# Patient Record
Sex: Male | Born: 1985 | Race: Black or African American | Hispanic: No | Marital: Single | Smoking: Current every day smoker
Health system: Southern US, Community
[De-identification: ages and names within clinical notes are randomized; demographics above are authoritative.]

## PROBLEM LIST (undated history)

## (undated) DIAGNOSIS — F909 Attention-deficit hyperactivity disorder, unspecified type: Secondary | ICD-10-CM

---
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
## 2006-01-22 ENCOUNTER — Emergency Department (HOSPITAL_COMMUNITY): Admission: EM | Admit: 2006-01-22 | Discharge: 2006-01-22 | Payer: Self-pay | Admitting: Emergency Medicine

## 2006-08-16 ENCOUNTER — Emergency Department (HOSPITAL_COMMUNITY): Admission: EM | Admit: 2006-08-16 | Discharge: 2006-08-16 | Payer: Self-pay | Admitting: Emergency Medicine

## 2007-11-21 ENCOUNTER — Encounter (INDEPENDENT_AMBULATORY_CARE_PROVIDER_SITE_OTHER): Payer: Self-pay | Admitting: Internal Medicine

## 2008-02-03 ENCOUNTER — Emergency Department (HOSPITAL_COMMUNITY): Admission: EM | Admit: 2008-02-03 | Discharge: 2008-02-04 | Payer: Self-pay | Admitting: Emergency Medicine

## 2008-05-10 ENCOUNTER — Emergency Department (HOSPITAL_COMMUNITY): Admission: EM | Admit: 2008-05-10 | Discharge: 2008-05-10 | Payer: Self-pay | Admitting: Emergency Medicine

## 2008-07-15 ENCOUNTER — Emergency Department (HOSPITAL_COMMUNITY): Admission: EM | Admit: 2008-07-15 | Discharge: 2008-07-15 | Payer: Self-pay | Admitting: Emergency Medicine

## 2009-02-16 ENCOUNTER — Emergency Department (HOSPITAL_COMMUNITY): Admission: EM | Admit: 2009-02-16 | Discharge: 2009-02-17 | Payer: Self-pay | Admitting: Emergency Medicine

## 2009-08-14 ENCOUNTER — Emergency Department (HOSPITAL_COMMUNITY): Admission: EM | Admit: 2009-08-14 | Discharge: 2009-08-14 | Payer: Self-pay | Admitting: Emergency Medicine

## 2010-04-17 ENCOUNTER — Emergency Department (HOSPITAL_COMMUNITY): Admission: EM | Admit: 2010-04-17 | Discharge: 2010-04-17 | Payer: Self-pay | Admitting: Family Medicine

## 2010-08-24 LAB — GC/CHLAMYDIA PROBE AMP, GENITAL
Chlamydia, DNA Probe: NEGATIVE
GC Probe Amp, Genital: NEGATIVE

## 2010-08-24 LAB — URINALYSIS, ROUTINE W REFLEX MICROSCOPIC
Bilirubin Urine: NEGATIVE
Ketones, ur: NEGATIVE mg/dL
Nitrite: NEGATIVE
Protein, ur: NEGATIVE mg/dL
Urobilinogen, UA: 0.2 mg/dL (ref 0.0–1.0)

## 2010-08-24 LAB — GLUCOSE, CAPILLARY

## 2010-08-26 ENCOUNTER — Emergency Department (HOSPITAL_COMMUNITY)
Admission: EM | Admit: 2010-08-26 | Discharge: 2010-08-26 | Disposition: A | Discharge status: Home / Self Care | Payer: Medicaid Other | Attending: Emergency Medicine | Admitting: Emergency Medicine

## 2010-08-26 DIAGNOSIS — F3289 Other specified depressive episodes: Secondary | ICD-10-CM | POA: Insufficient documentation

## 2010-08-26 DIAGNOSIS — I10 Essential (primary) hypertension: Secondary | ICD-10-CM | POA: Insufficient documentation

## 2010-08-26 DIAGNOSIS — R300 Dysuria: Secondary | ICD-10-CM | POA: Insufficient documentation

## 2010-08-26 DIAGNOSIS — R369 Urethral discharge, unspecified: Secondary | ICD-10-CM | POA: Insufficient documentation

## 2010-08-26 DIAGNOSIS — F329 Major depressive disorder, single episode, unspecified: Secondary | ICD-10-CM | POA: Insufficient documentation

## 2010-08-28 LAB — GC/CHLAMYDIA PROBE AMP, GENITAL: GC Probe Amp, Genital: UNDETERMINED

## 2011-03-04 LAB — URINALYSIS, ROUTINE W REFLEX MICROSCOPIC
Bilirubin Urine: NEGATIVE
Hgb urine dipstick: NEGATIVE
Protein, ur: NEGATIVE
Specific Gravity, Urine: 1.021
Urobilinogen, UA: 0.2

## 2011-03-04 LAB — GC/CHLAMYDIA PROBE AMP, GENITAL: GC Probe Amp, Genital: NEGATIVE

## 2011-03-06 LAB — GC/CHLAMYDIA PROBE AMP, GENITAL: Chlamydia, DNA Probe: NEGATIVE

## 2011-03-06 LAB — URINALYSIS, ROUTINE W REFLEX MICROSCOPIC
Hgb urine dipstick: NEGATIVE
Specific Gravity, Urine: 1.024 (ref 1.005–1.030)
Urobilinogen, UA: 1 mg/dL (ref 0.0–1.0)

## 2011-03-06 LAB — URINE MICROSCOPIC-ADD ON

## 2012-09-13 ENCOUNTER — Emergency Department (HOSPITAL_COMMUNITY)
Admission: EM | Admit: 2012-09-13 | Discharge: 2012-09-13 | Disposition: A | Discharge status: Home / Self Care | Payer: Self-pay | Attending: Emergency Medicine | Admitting: Emergency Medicine

## 2012-09-13 ENCOUNTER — Encounter (HOSPITAL_COMMUNITY): Payer: Self-pay | Admitting: Emergency Medicine

## 2012-09-13 DIAGNOSIS — N342 Other urethritis: Secondary | ICD-10-CM | POA: Insufficient documentation

## 2012-09-13 DIAGNOSIS — Z8659 Personal history of other mental and behavioral disorders: Secondary | ICD-10-CM | POA: Insufficient documentation

## 2012-09-13 DIAGNOSIS — F17200 Nicotine dependence, unspecified, uncomplicated: Secondary | ICD-10-CM | POA: Insufficient documentation

## 2012-09-13 HISTORY — DX: Attention-deficit hyperactivity disorder, unspecified type: F90.9

## 2012-09-13 LAB — URINALYSIS, ROUTINE W REFLEX MICROSCOPIC
Bilirubin Urine: NEGATIVE
Glucose, UA: NEGATIVE mg/dL
Ketones, ur: NEGATIVE mg/dL
Nitrite: NEGATIVE
pH: 6.5 (ref 5.0–8.0)

## 2012-09-13 MED ORDER — DOXYCYCLINE HYCLATE 100 MG PO TABS
100.0000 mg | ORAL_TABLET | Freq: Once | ORAL | Status: AC
  Administered 2012-09-13: 100 mg via ORAL
  Filled 2012-09-13: qty 1

## 2012-09-13 MED ORDER — LIDOCAINE HCL (PF) 1 % IJ SOLN
INTRAMUSCULAR | Status: AC
  Administered 2012-09-13: 2 mL
  Filled 2012-09-13: qty 5

## 2012-09-13 MED ORDER — DOXYCYCLINE HYCLATE 100 MG PO TABS: 100.0000 mg | ORAL_TABLET | Freq: Two times a day (BID) | ORAL | Status: DC

## 2012-09-13 MED ORDER — AZITHROMYCIN 250 MG PO TABS
1000.0000 mg | ORAL_TABLET | Freq: Once | ORAL | Status: AC
  Administered 2012-09-13: 1000 mg via ORAL
  Filled 2012-09-13: qty 4

## 2012-09-13 MED ORDER — CEFTRIAXONE SODIUM 250 MG IJ SOLR
250.0000 mg | Freq: Once | INTRAMUSCULAR | Status: AC
  Administered 2012-09-13: 250 mg via INTRAMUSCULAR
  Filled 2012-09-13: qty 250

## 2012-09-13 NOTE — ED Notes (Signed)
Patient with burning sensation when urinating.  No unprotected sex per patient.  No nausea or vomiting.

## 2012-09-13 NOTE — ED Provider Notes (Signed)
Medical screening examination/treatment/procedure(s) were performed by non-physician practitioner and as supervising physician I was immediately available for consultation/collaboration.  Beadie Matsunaga K Sharyl Panchal-Rasch, MD 09/13/12 0419 

## 2012-09-13 NOTE — ED Notes (Signed)
The patient is AOx4 and comfortable with his discharge instructions. 

## 2012-09-13 NOTE — ED Provider Notes (Signed)
History     CSN: [id number]  Arrival date & time 09/13/12  0036   First MD Initiated Contact with Patient 09/13/12 [phone]      Chief Complaint  Patient presents with  . Dysuria    (Consider location/radiation/quality/duration/timing/severity/associated sxs/prior treatment) HPI Comments: Patient reports, dysuria with every urination starting last night, per patient has had no unprotected intercourse  Patient is a 27 y.o. male presenting with dysuria. The history is provided by the patient.  Dysuria  This is a new problem. The problem occurs every urination. The problem has been gradually worsening. The quality of the pain is described as burning. The pain is at a severity of 3/10. The pain is mild. There has been no fever. Pertinent negatives include no chills and no flank pain. He has tried nothing for the symptoms.    Past Medical History  Diagnosis Date  . ADHD (attention deficit hyperactivity disorder)     History reviewed. No pertinent past surgical history.  History reviewed. No pertinent family history.  History  Substance Use Topics  . Smoking status: Current Some Day Smoker  . Smokeless tobacco: Not on file  . Alcohol Use: No      Review of Systems  Constitutional: Negative for fever and chills.  Genitourinary: Positive for dysuria and discharge. Negative for flank pain.  All other systems reviewed and are negative.    Allergies  Review of patient's allergies indicates no known allergies.  Home Medications   Current Outpatient Rx  Name  Route  Sig  Dispense  Refill  . doxycycline (VIBRA-TABS) 100 MG tablet   Oral   Take 1 tablet (100 mg total) by mouth 2 (two) times daily.   13 tablet   0     BP 139/72  Pulse 79  Temp(Src) 97.9 F (36.6 C) (Oral)  Resp 15  Ht 5\' 11"  (1.803 m)  Wt 175 lb (79.379 kg)  BMI 24.42 kg/m2  SpO2 98%  Physical Exam  Nursing note and vitals reviewed. Constitutional: He appears well-developed and well-nourished.   HENT:  Head: Normocephalic.  Eyes: Pupils are equal, round, and reactive to light.  Neck: Normal range of motion.  Cardiovascular: Normal rate.   Pulmonary/Chest: Effort normal.  Abdominal: Soft.  Genitourinary: Right testis shows no swelling and no tenderness. Right testis is descended. Left testis shows no swelling and no tenderness. Left testis is descended. No penile tenderness. Discharge found.  Musculoskeletal: Normal range of motion.  Neurological: He is alert.  Skin: Skin is warm.    ED Course  Procedures (including critical care time)  Labs Reviewed  URINALYSIS, ROUTINE W REFLEX MICROSCOPIC - Abnormal; Notable for the following:    APPearance CLOUDY (*)    Hgb urine dipstick TRACE (*)    Leukocytes, UA LARGE (*)    All other components within normal limits  URINE MICROSCOPIC-ADD ON - Abnormal; Notable for the following:    Bacteria, UA FEW (*)    All other components within normal limits  URINE CULTURE   No results found.   1. Urethritis       MDM   She was treated in the emergency, with IM Rocephin and by mouth azithromycin and, doxycycline.  He'll be discharged home with a prescription for doxycycline        Arman Filter, NP 09/13/12 0343  Arman Filter, NP 09/13/12 0344  Arman Filter, NP 09/13/12 [phone]

## 2012-09-14 LAB — URINE CULTURE
Colony Count: NO GROWTH
Culture: NO GROWTH

## 2012-11-26 ENCOUNTER — Emergency Department (HOSPITAL_COMMUNITY)
Admission: EM | Admit: 2012-11-26 | Discharge: 2012-11-27 | Disposition: A | Discharge status: Home / Self Care | Payer: Self-pay | Attending: Emergency Medicine | Admitting: Emergency Medicine

## 2012-11-26 ENCOUNTER — Encounter (HOSPITAL_COMMUNITY): Payer: Self-pay | Admitting: Emergency Medicine

## 2012-11-26 ENCOUNTER — Emergency Department (HOSPITAL_COMMUNITY): Payer: Self-pay

## 2012-11-26 DIAGNOSIS — N390 Urinary tract infection, site not specified: Secondary | ICD-10-CM | POA: Insufficient documentation

## 2012-11-26 DIAGNOSIS — R1013 Epigastric pain: Secondary | ICD-10-CM

## 2012-11-26 DIAGNOSIS — E876 Hypokalemia: Secondary | ICD-10-CM | POA: Insufficient documentation

## 2012-11-26 DIAGNOSIS — Z8659 Personal history of other mental and behavioral disorders: Secondary | ICD-10-CM | POA: Insufficient documentation

## 2012-11-26 DIAGNOSIS — F17200 Nicotine dependence, unspecified, uncomplicated: Secondary | ICD-10-CM | POA: Insufficient documentation

## 2012-11-26 LAB — COMPREHENSIVE METABOLIC PANEL
ALT: 13 U/L (ref 0–53)
Calcium: 8.6 mg/dL (ref 8.4–10.5)
Creatinine, Ser: 0.81 mg/dL (ref 0.50–1.35)
GFR calc Af Amer: >90 mL/min (ref >90)
GFR calc non Af Amer: >90 mL/min (ref >90)
Glucose, Bld: 123 mg/dL — ABNORMAL HIGH (ref 70–99)
Sodium: 139 mEq/L (ref 135–145)
Total Protein: 5.9 g/dL — ABNORMAL LOW (ref 6.0–8.3)

## 2012-11-26 LAB — URINALYSIS, ROUTINE W REFLEX MICROSCOPIC
Bilirubin Urine: NEGATIVE
Glucose, UA: NEGATIVE mg/dL
Hgb urine dipstick: NEGATIVE
Ketones, ur: NEGATIVE mg/dL
Protein, ur: NEGATIVE mg/dL
Urobilinogen, UA: 0.2 mg/dL (ref 0.0–1.0)

## 2012-11-26 LAB — CBC WITH DIFFERENTIAL/PLATELET
Basophils Absolute: 0.1 10*3/uL (ref 0.0–0.1)
Eosinophils Absolute: 0.2 10*3/uL (ref 0.0–0.7)
Eosinophils Relative: 4 % (ref 0–5)
HCT: 39.3 % (ref 39.0–52.0)
Lymphs Abs: 1.9 10*3/uL (ref 0.7–4.0)
MCH: 29.8 pg (ref 26.0–34.0)
MCV: 83.6 fL (ref 78.0–100.0)
Monocytes Absolute: 0.6 10*3/uL (ref 0.1–1.0)
Platelets: 229 10*3/uL (ref 150–400)
RDW: 12.6 % (ref 11.5–15.5)

## 2012-11-26 LAB — URINE MICROSCOPIC-ADD ON

## 2012-11-26 MED ORDER — GI COCKTAIL ~~LOC~~
30.0000 mL | Freq: Once | ORAL | Status: AC
  Administered 2012-11-26: 30 mL via ORAL
  Filled 2012-11-26: qty 30

## 2012-11-26 MED ORDER — CEFTRIAXONE SODIUM 1 G IJ SOLR
1.0000 g | Freq: Once | INTRAMUSCULAR | Status: AC
  Administered 2012-11-26: 1 g via INTRAVENOUS
  Filled 2012-11-26: qty 10

## 2012-11-26 MED ORDER — POTASSIUM CHLORIDE CRYS ER 20 MEQ PO TBCR
40.0000 meq | EXTENDED_RELEASE_TABLET | Freq: Once | ORAL | Status: AC
  Administered 2012-11-26: 40 meq via ORAL
  Filled 2012-11-26: qty 1

## 2012-11-26 MED ORDER — AZITHROMYCIN 250 MG PO TABS
1000.0000 mg | ORAL_TABLET | Freq: Once | ORAL | Status: AC
  Administered 2012-11-26: 1000 mg via ORAL
  Filled 2012-11-26: qty 4

## 2012-11-26 NOTE — ED Notes (Signed)
PT. REPORTS MID ABDOMINAL PAIN WITH DIARRHEA ONSET Monday , DENIES NAUSEA OR VOMITTING , NO FEVER OR CHILLS, RESPIRATIONS UNLABORED. NO CHEST PAIN .

## 2012-11-26 NOTE — ED Notes (Addendum)
Pt came to the ED because he has been having mid-abdominal pain  Since Monday. He stated that has has been having loose stools, as well.  He stated pain is worse with eating. He stated that there is no blood in stools. Pain is cramping feeling. Last time he ate was 2 hours ago. No N/V No cardiac or respiratory distress. He is alert and oriented . Will continue to monitor.

## 2012-11-26 NOTE — ED Provider Notes (Signed)
History    CSN: [id number] Arrival date & time 11/26/12  2152  First MD Initiated Contact with Patient 11/26/12 2303     Chief Complaint  Patient presents with  . Abdominal Pain   (Consider location/radiation/quality/duration/timing/severity/associated sxs/prior Treatment) Patient is a 27 y.o. male presenting with abdominal pain.  Abdominal Pain Associated symptoms include abdominal pain. Pertinent negatives include no chest pain, no headaches and no shortness of breath.  \ Hx per PT. Onset 6 days ago, epigastric and RUQ, feels like sharp and stabbing pain, did radiate to his back at first but not in the last few days. Tried alieve without relief. No known alleviating factors. Food makes it worse.  No black or tarry stools. No h/o same. No F/C. No recent travel. No sick contacts. No previous surgery. Pain is 5/10 now. No N/V is having multiple BMs daily.   Past Medical History  Diagnosis Date  . ADHD (attention deficit hyperactivity disorder)    History reviewed. No pertinent past surgical history. No family history on file. History  Substance Use Topics  . Smoking status: Current Some Day Smoker  . Smokeless tobacco: Not on file  . Alcohol Use: No    Review of Systems  Constitutional: Negative for fever and chills.  HENT: Negative for neck pain and neck stiffness.   Eyes: Negative for visual disturbance.  Respiratory: Negative for shortness of breath.   Cardiovascular: Negative for chest pain.  Gastrointestinal: Positive for abdominal pain. Negative for vomiting.  Genitourinary: Negative for dysuria, urgency, frequency, discharge, penile swelling and testicular pain.  Musculoskeletal: Negative for back pain.  Skin: Negative for rash.  Neurological: Negative for headaches.  All other systems reviewed and are negative.    Allergies  Review of patient's allergies indicates no known allergies.  Home Medications   Current Outpatient Rx  Name  Route  Sig  Dispense   Refill  . naproxen sodium (ANAPROX) 220 MG tablet   Oral   Take 440 mg by mouth daily as needed (for pain).          BP 129/69  Pulse 76  Temp(Src) 97.9 F (36.6 C) (Oral)  Resp 14  SpO2 97% Physical Exam  Constitutional: He is oriented to person, place, and time. He appears well-developed and well-nourished.  HENT:  Head: Normocephalic and atraumatic.  Eyes: EOM are normal. Pupils are equal, round, and reactive to light.  Neck: Neck supple.  Cardiovascular: Normal rate, regular rhythm and intact distal pulses.   Pulmonary/Chest: Effort normal and breath sounds normal. No respiratory distress.  Abdominal: Soft. Bowel sounds are normal. He exhibits no distension. There is no rebound and no guarding.  TTP Epigastric and RUQ, neg Murphys  Musculoskeletal: Normal range of motion. He exhibits no edema.  Neurological: He is alert and oriented to person, place, and time.  Skin: Skin is warm and dry.    ED Course  Procedures   Results for orders placed during the hospital encounter of 11/26/12  URINALYSIS, ROUTINE W REFLEX MICROSCOPIC      Result Value Range   Color, Urine AMBER (*) YELLOW   APPearance CLOUDY (*) CLEAR   Specific Gravity, Urine 1.025  1.005 - 1.030   pH 6.0  5.0 - 8.0   Glucose, UA NEGATIVE  NEGATIVE mg/dL   Hgb urine dipstick NEGATIVE  NEGATIVE   Bilirubin Urine NEGATIVE  NEGATIVE   Ketones, ur NEGATIVE  NEGATIVE mg/dL   Protein, ur NEGATIVE  NEGATIVE mg/dL   Urobilinogen, UA 0.2  0.0 - 1.0 mg/dL   Nitrite NEGATIVE  NEGATIVE   Leukocytes, UA SMALL (*) NEGATIVE  CBC WITH DIFFERENTIAL      Result Value Range   WBC 6.0  4.0 - 10.5 K/uL   RBC 4.70  4.22 - 5.81 MIL/uL   Hemoglobin 14.0  13.0 - 17.0 g/dL   HCT 16.1  09.6 - 04.5 %   MCV 83.6  78.0 - 100.0 fL   MCH 29.8  26.0 - 34.0 pg   MCHC 35.6  30.0 - 36.0 g/dL   RDW 40.9  81.1 - 91.4 %   Platelets 229  150 - 400 K/uL   Neutrophils Relative % 54  43 - 77 %   Neutro Abs 3.2  1.7 - 7.7 K/uL    Lymphocytes Relative 32  12 - 46 %   Lymphs Abs 1.9  0.7 - 4.0 K/uL   Monocytes Relative 10  3 - 12 %   Monocytes Absolute 0.6  0.1 - 1.0 K/uL   Eosinophils Relative 4  0 - 5 %   Eosinophils Absolute 0.2  0.0 - 0.7 K/uL   Basophils Relative 1  0 - 1 %   Basophils Absolute 0.1  0.0 - 0.1 K/uL  COMPREHENSIVE METABOLIC PANEL      Result Value Range   Sodium 139  135 - 145 mEq/L   Potassium 3.1 (*) 3.5 - 5.1 mEq/L   Chloride 104  96 - 112 mEq/L   CO2 26  19 - 32 mEq/L   Glucose, Bld 123 (*) 70 - 99 mg/dL   BUN 10  6 - 23 mg/dL   Creatinine, Ser 7.82  0.50 - 1.35 mg/dL   Calcium 8.6  8.4 - 95.6 mg/dL   Total Protein 5.9 (*) 6.0 - 8.3 g/dL   Albumin 3.1 (*) 3.5 - 5.2 g/dL   AST 22  0 - 37 U/L   ALT 13  0 - 53 U/L   Alkaline Phosphatase 69  39 - 117 U/L   Total Bilirubin 0.5  0.3 - 1.2 mg/dL   GFR calc non Af Amer >90  >90 mL/min   GFR calc Af Amer >90  >90 mL/min  LIPASE, BLOOD      Result Value Range   Lipase 18  11 - 59 U/L  URINE MICROSCOPIC-ADD ON      Result Value Range   WBC, UA 11-20  <3 WBC/hpf   Bacteria, UA FEW (*) RARE   Urine-Other MUCOUS PRESENT     US Abdomen Complete  11/27/2012   *RADIOLOGY REPORT*  Clinical Data:  Right upper quadrant abdominal pain, epigastric abdominal pain, diarrhea -  COMPLETE ABDOMINAL ULTRASOUND  Comparison:  None.  Findings:  Gallbladder:  The gallbladder is under distended.  No definite gallbladder wall thickening or pericholecystic fluid.  No echogenic gallstones or gallbladder sludge.  Negative sonographic Murphy's sign.  Common bile duct:  Normal in size measuring 3 mm in diameter  Liver:  Homogeneous hepatic echotexture.  No discrete hepatic lesions.  No definite evidence of intrahepatic biliary ductal dilatation.  No ascites.  IVC:  Appears normal.  Pancreas:  Limited visualization of the pancreatic head and neck is normal.  Visualization of the pancreatic body and tail is obscured by bowel gas.  Spleen:  Normal in size measuring 6.5 cm in  length.  Right Kidney:  Normal cortical thickness, echogenicity and size, measuring 11.5 cm in length.  No focal renal lesions.  No echogenic renal stones.  No  urinary obstruction.  Left Kidney:  Normal cortical thickness, echogenicity and size, measuring 11.7 cm in length.  No focal renal lesions.  No echogenic renal stones.  No urinary obstruction.  Abdominal aorta:  No aneurysm identified.  IMPRESSION: Under distended but otherwise normal appearing gallbladder. Otherwise, unremarkable abdominal ultrasound.   Original Report Authenticated By: Tacey Ruiz, MD    UA reviewed - denies UTI symptoms or any recent sexual activity. Old records reviewed was treated for urethritis April 2014. U Cx at that time no growth. Prior U Cx polymicrobial.   IV rocephin, zofran, protonix. PO azithro and potassium Improved with GI cocktail  Given chief complaint of epigastric pain and denies and GU or UTI symptoms, will not prescribe Doxy. RX Keflex with U Cx pending. Outpatient referral provided. GERD precautions. RX pepcid  MDM  Epigastric pain x 3 days worse with food  Labs, UA, [place]  Medications provided  VS, old records as above and nurses notes reviewed  Sunnie Nielsen, MD 11/27/12 [phone]

## 2012-11-26 NOTE — ED Notes (Signed)
Report given to Don RN.

## 2012-11-27 MED ORDER — PANTOPRAZOLE SODIUM 40 MG PO TBEC
40.0000 mg | DELAYED_RELEASE_TABLET | Freq: Once | ORAL | Status: AC
  Administered 2012-11-27: 40 mg via ORAL
  Filled 2012-11-27: qty 1

## 2012-11-27 MED ORDER — FAMOTIDINE 20 MG PO TABS: 20.0000 mg | ORAL_TABLET | Freq: Two times a day (BID) | ORAL | Status: DC

## 2012-11-27 MED ORDER — CEPHALEXIN 500 MG PO CAPS: 500.0000 mg | ORAL_CAPSULE | Freq: Four times a day (QID) | ORAL | Status: DC

## 2012-11-27 MED ORDER — ONDANSETRON HCL 4 MG/2ML IJ SOLN
INTRAMUSCULAR | Status: AC
  Filled 2012-11-27: qty 2

## 2012-11-27 MED ORDER — ONDANSETRON HCL 4 MG/2ML IJ SOLN
4.0000 mg | Freq: Once | INTRAMUSCULAR | Status: AC
  Administered 2012-11-27: 4 mg via INTRAVENOUS

## 2012-11-28 LAB — URINE CULTURE

## 2016-01-10 ENCOUNTER — Telehealth: Payer: Self-pay

## 2016-01-10 NOTE — Telephone Encounter (Signed)
Patient contacted regarding new intake appointment. Date and time given. Information given regarding documents needed to qualify for financial eligibility.  Minh Jasper K Jossalin Chervenak, RN  

## 2016-01-14 ENCOUNTER — Ambulatory Visit: Payer: Medicaid Other

## 2016-02-13 ENCOUNTER — Ambulatory Visit (INDEPENDENT_AMBULATORY_CARE_PROVIDER_SITE_OTHER): Payer: Self-pay

## 2016-02-13 ENCOUNTER — Ambulatory Visit: Payer: Self-pay

## 2016-02-13 DIAGNOSIS — Z79899 Other long term (current) drug therapy: Secondary | ICD-10-CM

## 2016-02-13 DIAGNOSIS — Z23 Encounter for immunization: Secondary | ICD-10-CM

## 2016-02-13 DIAGNOSIS — Z113 Encounter for screening for infections with a predominantly sexual mode of transmission: Secondary | ICD-10-CM

## 2016-02-13 DIAGNOSIS — B20 Human immunodeficiency virus [HIV] disease: Secondary | ICD-10-CM

## 2016-02-13 LAB — COMPLETE METABOLIC PANEL WITH GFR
ALT: 9 U/L (ref 9–46)
AST: 14 U/L (ref 10–40)
Albumin: 4.2 g/dL (ref 3.6–5.1)
Alkaline Phosphatase: 77 U/L (ref 40–115)
BILIRUBIN TOTAL: 0.6 mg/dL (ref 0.2–1.2)
BUN: 9 mg/dL (ref 7–25)
CO2: 26 mmol/L (ref 20–31)
Calcium: 9.2 mg/dL (ref 8.6–10.3)
Chloride: 107 mmol/L (ref 98–110)
Creat: 0.76 mg/dL (ref 0.60–1.35)
GLUCOSE: 92 mg/dL (ref 65–99)
POTASSIUM: 3.4 mmol/L — AB (ref 3.5–5.3)
SODIUM: 140 mmol/L (ref 135–146)
TOTAL PROTEIN: 6.9 g/dL (ref 6.1–8.1)

## 2016-02-13 LAB — CBC WITH DIFFERENTIAL/PLATELET
BASOS ABS: 39 {cells}/uL (ref 0–200)
Basophils Relative: 1 %
EOS ABS: 195 {cells}/uL (ref 15–500)
EOS PCT: 5 %
HCT: 38.2 % — ABNORMAL LOW (ref 38.5–50.0)
Hemoglobin: 13.3 g/dL (ref 13.2–17.1)
LYMPHS PCT: 36 %
Lymphs Abs: 1404 cells/uL (ref 850–3900)
MCH: 29.7 pg (ref 27.0–33.0)
MCHC: 34.8 g/dL (ref 32.0–36.0)
MCV: 85.3 fL (ref 80.0–100.0)
MONOS PCT: 11 %
MPV: 9.3 fL (ref 7.5–12.5)
Monocytes Absolute: 429 cells/uL (ref 200–950)
NEUTROS ABS: 1833 {cells}/uL (ref 1500–7800)
NEUTROS PCT: 47 %
PLATELETS: 230 10*3/uL (ref 140–400)
RBC: 4.48 MIL/uL (ref 4.20–5.80)
RDW: 13.3 % (ref 11.0–15.0)
WBC: 3.9 10*3/uL (ref 3.8–10.8)

## 2016-02-13 LAB — LIPID PANEL
CHOL/HDL RATIO: 3 ratio (ref <=5.0)
Cholesterol: 148 mg/dL (ref 125–200)
HDL: 49 mg/dL (ref >=40)
LDL CALC: 87 mg/dL (ref <130)
Triglycerides: 61 mg/dL (ref <150)
VLDL: 12 mg/dL (ref <30)

## 2016-02-14 ENCOUNTER — Encounter: Payer: Self-pay | Admitting: Internal Medicine

## 2016-02-14 LAB — URINALYSIS
Bilirubin Urine: NEGATIVE
GLUCOSE, UA: NEGATIVE
HGB URINE DIPSTICK: NEGATIVE
Ketones, ur: NEGATIVE
LEUKOCYTES UA: NEGATIVE
Nitrite: NEGATIVE
PROTEIN: NEGATIVE
Specific Gravity, Urine: 1.024 (ref 1.001–1.035)
pH: 6 (ref 5.0–8.0)

## 2016-02-14 LAB — HEPATITIS B SURFACE ANTIGEN: HEP B S AG: NEGATIVE

## 2016-02-14 LAB — URINE CYTOLOGY ANCILLARY ONLY
Chlamydia: NEGATIVE
Neisseria Gonorrhea: NEGATIVE

## 2016-02-14 LAB — HIV-1 RNA ULTRAQUANT REFLEX TO GENTYP+
HIV 1 RNA QUANT: 14057 {copies}/mL — AB (ref <20)
HIV-1 RNA QUANT, LOG: 4.15 {Log_copies}/mL — AB (ref <1.30)

## 2016-02-14 LAB — HEPATITIS C ANTIBODY: HCV AB: NEGATIVE

## 2016-02-14 LAB — QUANTIFERON TB GOLD ASSAY (BLOOD)
Interferon Gamma Release Assay: NEGATIVE
MITOGEN-NIL SO: 3.44 [IU]/mL
QUANTIFERON NIL VALUE: 0.03 [IU]/mL

## 2016-02-14 LAB — T-HELPER CELL (CD4) - (RCID CLINIC ONLY)
CD4 T CELL ABS: 490 /uL (ref 400–2700)
CD4 T CELL HELPER: 34 % (ref 33–55)

## 2016-02-14 LAB — HEPATITIS B SURFACE ANTIBODY,QUALITATIVE

## 2016-02-14 LAB — RPR

## 2016-02-14 LAB — HEPATITIS A ANTIBODY, TOTAL: HEP A TOTAL AB: NONREACTIVE

## 2016-02-14 LAB — HEPATITIS B CORE ANTIBODY, TOTAL: Hep B Core Total Ab: NONREACTIVE

## 2016-02-18 LAB — HLA B*5701: HLA-B 5701 W/RFLX HLA-B HIGH: NEGATIVE

## 2016-02-26 LAB — HIV-1 GENOTYPR PLUS

## 2016-03-12 ENCOUNTER — Ambulatory Visit: Payer: Self-pay | Admitting: *Deleted

## 2016-03-12 ENCOUNTER — Encounter: Payer: Self-pay | Admitting: Internal Medicine

## 2016-03-12 ENCOUNTER — Ambulatory Visit (INDEPENDENT_AMBULATORY_CARE_PROVIDER_SITE_OTHER): Payer: Self-pay | Admitting: Internal Medicine

## 2016-03-12 DIAGNOSIS — F909 Attention-deficit hyperactivity disorder, unspecified type: Secondary | ICD-10-CM | POA: Insufficient documentation

## 2016-03-12 DIAGNOSIS — B20 Human immunodeficiency virus [HIV] disease: Secondary | ICD-10-CM

## 2016-03-12 DIAGNOSIS — Z21 Asymptomatic human immunodeficiency virus [HIV] infection status: Secondary | ICD-10-CM | POA: Insufficient documentation

## 2016-03-12 DIAGNOSIS — F908 Attention-deficit hyperactivity disorder, other type: Secondary | ICD-10-CM

## 2016-03-12 MED ORDER — ELVITEG-COBIC-EMTRICIT-TENOFAF 150-150-200-10 MG PO TABS: 1.0000 | ORAL_TABLET | Freq: Every day | ORAL | 5 refills | Status: DC

## 2016-03-12 NOTE — Progress Notes (Signed)
Patient ID: Aaron Stephens, male    DOB: 04/25/1986, 30 y.o.   MRN: [id number]  Reason for visit: to establish care as a new patient with HIV  HPI:   Patient was first diagnosed after giving blood earlier this year.  He was tested as part blood transfusion.  Previously tested negative while giving blood.  The CD4 count is 490, viral load 14,057.  There have been no associated symptoms.  He endorses only heterosexual contact.  History of gonorrhea, no history of syphilis.  Accepting of diagnosis and feels ready for treatment.  Has already finished paperwork for ADAP and is approved.  No weight loss, no concerns.  Has condoms.   Past Medical History:  Diagnosis Date  . ADHD (attention deficit hyperactivity disorder)     Prior to Admission medications   Medication Sig Start Date End Date Taking? Authorizing Provider  elvitegravir-cobicistat-emtricitabine-tenofovir (GENVOYA) 150-150-200-10 MG TABS tablet Take 1 tablet by mouth daily. 03/12/16   Gardiner Barefootobert W Rafeal Skibicki, MD    No Known Allergies  Social History  Substance Use Topics  . Smoking status: Current Some Day Smoker    Types: Cigarettes    Start date: 06/02/2007  . Smokeless tobacco: Never Used  . Alcohol use No    FMH: no renal disease, cardiovascular disease  Review of Systems Constitutional: negative for fevers, chills, sweats, fatigue, malaise, anorexia and weight loss Respiratory: negative for cough Cardiovascular: negative for dyspnea Gastrointestinal: negative for diarrhea Musculoskeletal: negative for myalgias and arthralgias All other systems reviewed and are negative   CONSTITUTIONAL:in no apparent distress and alert  Vitals:   03/12/16 0918  BP: 114/66  Pulse: 74  Temp: 97.9 F (36.6 C)   EYES: anicteric HENT: no thrush CARD:Cor RRR and No murmurs RESP:CTA B; normal respiratory effort NW:GNFAOGI:Bowel sounds are normal, liver is not enlarged, spleen is not enlarged MS:no pedal edema noted SKIN: no rashes NEURO:  non-focal  Lab Results  Component Value Date   HIV1RNAQUANT 14,057 (H) 02/13/2016    Assessment: new patient here with HIV.  Discussed with patient treatment options and side effects, benefits of treatment, long term outcomes.  I discussed the severity of untreated HIV including higher cancer risk, opportunistic infections, renal failure.  Also discussed needing to use condoms, partner disclosure, necessary vaccines, blood monitoring.  All questions answered.    Plan: 1) start Genvoya 1 time daily 2) labs in 4 weeks 3) follow up with me in 5 weeks

## 2016-03-12 NOTE — BH Specialist Note (Signed)
Counselor met with Aaron Stephens in the exam room for a warm hand off.  Patient was oriented times four with good affect and dress.  Patient was alert but not very talkative.  Patient shared that he would like to talk to someone about processing through his grief of close family deaths over the last year. Counselor provided support and encouragement.  Counselor provided patient a contact information and encouraged him to make an appointment when he checked out today.  Patient is scheduled for an appointment next week.   Rolena Infante, MA, [facility]

## 2016-03-17 ENCOUNTER — Ambulatory Visit: Payer: Self-pay | Admitting: *Deleted

## 2016-04-02 ENCOUNTER — Encounter: Payer: Self-pay | Admitting: *Deleted

## 2016-04-13 ENCOUNTER — Other Ambulatory Visit: Payer: Self-pay

## 2016-04-16 ENCOUNTER — Encounter: Payer: Self-pay | Admitting: Internal Medicine

## 2016-04-16 ENCOUNTER — Ambulatory Visit (INDEPENDENT_AMBULATORY_CARE_PROVIDER_SITE_OTHER): Payer: Self-pay | Admitting: Internal Medicine

## 2016-04-16 VITALS — BP 113/76 | HR 73 | Temp 98.3°F | Ht 71.0 in | Wt 171.0 lb

## 2016-04-16 DIAGNOSIS — F329 Major depressive disorder, single episode, unspecified: Secondary | ICD-10-CM | POA: Insufficient documentation

## 2016-04-16 DIAGNOSIS — F320 Major depressive disorder, single episode, mild: Secondary | ICD-10-CM

## 2016-04-16 DIAGNOSIS — B20 Human immunodeficiency virus [HIV] disease: Secondary | ICD-10-CM

## 2016-04-16 DIAGNOSIS — Z21 Asymptomatic human immunodeficiency virus [HIV] infection status: Secondary | ICD-10-CM

## 2016-04-16 DIAGNOSIS — F32A Depression, unspecified: Secondary | ICD-10-CM | POA: Insufficient documentation

## 2016-04-16 MED ORDER — ELVITEG-COBIC-EMTRICIT-TENOFAF 150-150-200-10 MG PO TABS: 1.0000 | ORAL_TABLET | Freq: Every day | ORAL | 5 refills | Status: DC

## 2016-04-16 NOTE — Assessment & Plan Note (Signed)
He plans to start now.  Labs in 3 weeks with Pharm D follow up in 4 weeks.  3 months with me with CD4 and viral load before if on medications.

## 2016-04-16 NOTE — Assessment & Plan Note (Signed)
Seems situational with job loss.  No SI.  Will refer to counseling.

## 2016-04-16 NOTE — Progress Notes (Signed)
CC: Follow up for HIV  Interval history: Comes in for follow up.  Was seen as a new patient last visit and after discussion was to start Genvoya.  He tells me though he went to the pharmacy and no prescription there.  He also endorses depression and interested in seeing our counselor.  Recently lost his job.  No other new issues.    Prior to Admission medications   Medication Sig Start Date End Date Taking? Authorizing Provider  elvitegravir-cobicistat-emtricitabine-tenofovir (GENVOYA) 150-150-200-10 MG TABS tablet Take 1 tablet by mouth daily. 04/16/16   Gardiner Barefootobert W Comer, MD    Review of Systems Constitutional: negative for fatigue and malaise Gastrointestinal: negative for diarrhea All other systems reviewed and are negative    Physical Exam: CONSTITUTIONAL:in no apparent distress  Vitals:   04/16/16 0941  BP: 113/76  Pulse: 73  Temp: 98.3 F (36.8 C)   Eyes: anicteric HENT: no thrush, no cervical lymphadenopathy Respiratory: Normal respiratory effort; CTA B  Lab Results  Component Value Date   HIV1RNAQUANT 14,057 (H) 02/13/2016   No components found for: HIV1GENOTYPRPLUS No components found for: THELPERCELL

## 2016-04-20 ENCOUNTER — Ambulatory Visit: Payer: Self-pay

## 2016-04-20 NOTE — BH Specialist Note (Deleted)
I met with Aaron Stephens for the first time today after having had a "warm handoff" meeting last week. He is struggling with symptoms of depression and anxiety, directly related to him losing his job at [employer] over a dispute with a Mudlogger. He has a 29 year old son who lives with him, who's mother is mentally ill and not involved in his life. Aaron Stephens is living with family right now and wants to get back on his feet. He has depressed mood, occasional crying spells, anxiety, and occasional panic attacks. I gave him a contact for a temp agency and also introduced him to Shawna Orleans at [facility] for case management. Plan to meet again next week. Adjustment Disorder with depressed and anxious mood. Curley Spice, LCSW

## 2016-04-27 ENCOUNTER — Ambulatory Visit: Payer: Self-pay

## 2016-07-23 ENCOUNTER — Telehealth: Payer: Self-pay | Admitting: *Deleted

## 2016-07-23 NOTE — Telephone Encounter (Signed)
RN received a referral from Dr Luciana Axeomer to offer services to the patient for HIV Case management for assistance with removing barriers to care/medication adherence.   RN reviewed the chart and noted the patient's ADAP coverage with expire on 08/29/16. RN contacted the patient and had to leave a message that stated my name and that I would like to make him aware that his insurance coverage will expire in March. RN asked that the patient return my call or text me so we can arrange a appt for his to renew his insurance coverage. RN left my name and number again for a call or text. RN also offered to bring the ppk to the patient to ensure that we renew his insurance so he can continue to get his medication.

## 2016-11-19 ENCOUNTER — Emergency Department (HOSPITAL_COMMUNITY)
Admission: EM | Admit: 2016-11-19 | Discharge: 2016-11-19 | Disposition: A | Discharge status: Home / Self Care | Payer: Self-pay | Attending: Emergency Medicine | Admitting: Emergency Medicine

## 2016-11-19 ENCOUNTER — Emergency Department (HOSPITAL_COMMUNITY): Payer: Self-pay

## 2016-11-19 ENCOUNTER — Encounter (HOSPITAL_COMMUNITY): Payer: Self-pay | Admitting: Emergency Medicine

## 2016-11-19 DIAGNOSIS — R072 Precordial pain: Secondary | ICD-10-CM | POA: Insufficient documentation

## 2016-11-19 DIAGNOSIS — R0789 Other chest pain: Secondary | ICD-10-CM

## 2016-11-19 DIAGNOSIS — F17210 Nicotine dependence, cigarettes, uncomplicated: Secondary | ICD-10-CM | POA: Insufficient documentation

## 2016-11-19 DIAGNOSIS — F909 Attention-deficit hyperactivity disorder, unspecified type: Secondary | ICD-10-CM | POA: Insufficient documentation

## 2016-11-19 LAB — CBC
HEMATOCRIT: 36.2 % — AB (ref 39.0–52.0)
Hemoglobin: 12.5 g/dL — ABNORMAL LOW (ref 13.0–17.0)
MCH: 29.3 pg (ref 26.0–34.0)
MCHC: 34.5 g/dL (ref 30.0–36.0)
MCV: 84.8 fL (ref 78.0–100.0)
PLATELETS: 209 10*3/uL (ref 150–400)
RBC: 4.27 MIL/uL (ref 4.22–5.81)
RDW: 12.6 % (ref 11.5–15.5)
WBC: 4.1 10*3/uL (ref 4.0–10.5)

## 2016-11-19 LAB — I-STAT TROPONIN, ED: Troponin i, poc: 0.01 ng/mL (ref 0.00–0.08)

## 2016-11-19 LAB — BASIC METABOLIC PANEL
Anion gap: 8 (ref 5–15)
BUN: 8 mg/dL (ref 6–20)
CO2: 24 mmol/L (ref 22–32)
Calcium: 8.8 mg/dL — ABNORMAL LOW (ref 8.9–10.3)
Chloride: 104 mmol/L (ref 101–111)
Creatinine, Ser: 0.83 mg/dL (ref 0.61–1.24)
Glucose, Bld: 115 mg/dL — ABNORMAL HIGH (ref 65–99)
Potassium: 3.3 mmol/L — ABNORMAL LOW (ref 3.5–5.1)
SODIUM: 136 mmol/L (ref 135–145)

## 2016-11-19 NOTE — ED Provider Notes (Signed)
[facility] Provider Note   CSN: [id number] Arrival date & time: 11/19/16  0115  By signing my name below, I, Aaron Stephens, attest that this documentation has been prepared under the direction and in the presence of Derwood KaplanNanavati, Camil Wilhelmsen, MD. Electronically Signed: Diona BrownerJennifer Stephens, ED Scribe. 11/19/16. 2:48 AM.  History   Chief Complaint Chief Complaint  Patient presents with  . Chest Pain    HPI Aaron Stephens is a 31 y.o. male with a PMHx of HIV who presents to the Emergency Department complaining of intermittent, bilateral chest pressure and tightness that started ~ 7 pm. Currently, he rates his pain a 5/10 severity, but at its worst it is an 8/10 severity. Pt ate ~ 1 hour before pain started. He was watching TV when onset occurred. No modifying factors noted. FHx of heart disease. No hx of blood clots. Never has had pain like this before. Born with a heart murmer. Smokes occasionally. No drug use. Pt denies nausea, vomiting, numbness, SOB, visual changes, cough, fever, chills, wheezing.  The history is provided by the patient. No language interpreter was used.    Past Medical History:  Diagnosis Date  . ADHD (attention deficit hyperactivity disorder)     Patient Active Problem List   Diagnosis Date Noted  . Depression 04/16/2016  . Human immunodeficiency virus I infection (HCC) 03/12/2016  . Attention deficit hyperactivity disorder (ADHD) 03/12/2016    History reviewed. No pertinent surgical history.     Home Medications    Prior to Admission medications   Medication Sig Start Date End Date Taking? Authorizing Provider  elvitegravir-cobicistat-emtricitabine-tenofovir (GENVOYA) 150-150-200-10 MG TABS tablet Take 1 tablet by mouth daily. 04/16/16   Gardiner Barefootomer, Robert W, MD    Family History History reviewed. No pertinent family history.  Social History Social History  Substance Use Topics  . Smoking status: Current Some Day Smoker    Packs/day: 1.00    Types:  Cigarettes    Start date: 06/02/2007  . Smokeless tobacco: Never Used  . Alcohol use No     Allergies   Patient has no known allergies.   Review of Systems Review of Systems  Constitutional: Negative for chills and fever.  Eyes: Negative for visual disturbance.  Respiratory: Negative for cough, shortness of breath and wheezing.   Cardiovascular: Positive for chest pain.  Gastrointestinal: Negative for nausea and vomiting.  Neurological: Negative for numbness.  All other systems reviewed and are negative.    Physical Exam Updated Vital Signs BP 112/74   Pulse (!) 57   Temp 98.3 F (36.8 C) (Oral)   Resp 15   Ht 5\' 11"  (1.803 m)   Wt 77.6 kg (171 lb)   SpO2 99%   BMI 23.85 kg/m   Physical Exam  Constitutional: He is oriented to person, place, and time. He appears well-developed and well-nourished.  HENT:  Head: Normocephalic and atraumatic.  Eyes: EOM are normal.  Neck: Normal range of motion.  Cardiovascular: Normal rate, regular rhythm and normal heart sounds.   Pulmonary/Chest: Effort normal.  Lungs are clear to ausculation.   Abdominal: He exhibits no distension.  Musculoskeletal: Normal range of motion.  Neurological: He is alert and oriented to person, place, and time.  Psychiatric: He has a normal mood and affect.  Nursing note and vitals reviewed.    ED Treatments / Results  DIAGNOSTIC STUDIES: Oxygen Saturation is 98% on RA, normal by my interpretation.   COORDINATION OF CARE: 2:48 AM-Discussed next steps with pt. Pt verbalized  understanding and is agreeable with the plan.   Labs (all labs ordered are listed, but only abnormal results are displayed) Labs Reviewed  BASIC METABOLIC PANEL - Abnormal; Notable for the following:       Result Value   Potassium 3.3 (*)    Glucose, Bld 115 (*)    Calcium 8.8 (*)    All other components within normal limits  CBC - Abnormal; Notable for the following:    Hemoglobin 12.5 (*)    HCT 36.2 (*)    All  other components within normal limits  I-STAT TROPOININ, ED    EKG  EKG Interpretation  Date/Time:  Thursday November 19 2016 01:16:01 EDT Ventricular Rate:  67 PR Interval:    QRS Duration: 98 QT Interval:  401 QTC Calculation: 424 R Axis:   65 Text Interpretation:  Sinus rhythm ST elev, probable normal early repol pattern No acute changes No significant change since last tracing Confirmed by Derwood Kaplan ([id number]) on 11/19/2016 2:33:04 AM       Radiology Dg Chest 2 View  Result Date: 11/19/2016 CLINICAL DATA:  Initial evaluation for acute chest pain. EXAM: CHEST  2 VIEW COMPARISON:  None available. FINDINGS: The cardiac and mediastinal silhouettes are within normal limits. The lungs are normally inflated. N mild patchy opacity at the medial aspect of the lung bases bilaterally. Atelectasis is favored, although focal infiltrates could be considered in the correct clinical setting. No other focal airspace disease. No pulmonary edema or pleural effusion. No pneumothorax. No acute osseous abnormality identified. IMPRESSION: Patchy bibasilar opacities within the medial aspect of the lower lobes bilaterally, right slightly greater than left. Atelectasis is favored, although infiltrates could be considered in the correct clinical setting. Electronically Signed   By: Rise Mu M.D.   On: 11/19/2016 01:50    Procedures Procedures (including critical care time)  Medications Ordered in ED Medications - No data to display   Initial Impression / Assessment and Plan / ED Course  I have reviewed the triage vital signs and the nursing notes.  Pertinent labs & imaging results that were available during my care of the patient were reviewed by me and considered in my medical decision making (see chart for details).     I personally performed the services described in this documentation, which was scribed in my presence. The recorded information has been reviewed and is  accurate.  Differential diagnosis includes: ACS syndrome Myocarditis Pericarditis Endocarditis Pericardial effusion / tamponade Pneumonia Pleural effusion / Pulmonary edema PE Pneumothorax Musculoskeletal pain PUD / Gastritis / Esophagitis Esophageal spasm   Pt comes in with cc of chest pain. CP is atypical. His pain has been constant for > 1 day. Pt's trop is neg, ekg has no acute changes. Pt has HIV. Lungs are clear, he has no cough, no fevers, no dib - clinically there is no concerns for PNA.  Pt advised to see pcp in 1 week. Strict ER return precautions have been discussed, and patient is agreeing with the plan and is comfortable with the workup done and the recommendations from the ER.    Final Clinical Impressions(s) / ED Diagnoses   Final diagnoses:  Atypical chest pain  Precordial chest pain    New Prescriptions Discharge Medication List as of 11/19/2016  3:30 AM        Derwood Kaplan, MD 11/19/16 [phone]

## 2016-11-19 NOTE — ED Notes (Signed)
324 mg ASA and 1 nitro sl given by EMS prior to arrival to ED, cp down from 9/10 to 5/10.

## 2016-11-19 NOTE — ED Triage Notes (Signed)
Pt brought to ED by [facility] for 5/10 bilateral cp that started last night. No nausea, vomiting, SOB or dizziness.

## 2016-11-19 NOTE — ED Provider Notes (Signed)
[facility] Provider Note   CSN: [id number] Arrival date & time: 11/19/16  0115     History   Chief Complaint Chief Complaint  Patient presents with  . Chest Pain    HPI Aaron Stephens is a 31 y.o. male.  HPI Pt comes in with cc of chest pain. Pt has  Hx of HIV and reports hx of CAD in the family. Pt described the chest pain as tightness and pressure type pain, bilaterally that started on the R side. Pain is constant with no specific aggravating or relieving factor. Pt's pain intensity has been waxing and waning. No associated n/v/f/c/cough/dib. Pt denies heavy smoking or drug use. No night sweats.  Past Medical History:  Diagnosis Date  . ADHD (attention deficit hyperactivity disorder)     Patient Active Problem List   Diagnosis Date Noted  . Depression 04/16/2016  . Human immunodeficiency virus I infection (HCC) 03/12/2016  . Attention deficit hyperactivity disorder (ADHD) 03/12/2016    History reviewed. No pertinent surgical history.     Home Medications    Prior to Admission medications   Medication Sig Start Date End Date Taking? Authorizing Provider  elvitegravir-cobicistat-emtricitabine-tenofovir (GENVOYA) 150-150-200-10 MG TABS tablet Take 1 tablet by mouth daily. 04/16/16   Gardiner Barefootomer, Robert W, MD    Family History History reviewed. No pertinent family history.  Social History Social History  Substance Use Topics  . Smoking status: Current Some Day Smoker    Packs/day: 1.00    Types: Cigarettes    Start date: 06/02/2007  . Smokeless tobacco: Never Used  . Alcohol use No     Allergies   Patient has no known allergies.   Review of Systems Review of Systems  Constitutional: Negative for activity change.  Respiratory: Negative for cough and shortness of breath.   Cardiovascular: Positive for chest pain.  Gastrointestinal: Negative for abdominal pain.  Genitourinary: Negative for dysuria.  All other systems reviewed and are  negative.    Physical Exam Updated Vital Signs BP 112/74   Pulse (!) 57   Temp 98.3 F (36.8 C) (Oral)   Resp 15   Ht 5\' 11"  (1.803 m)   Wt 77.6 kg (171 lb)   SpO2 99%   BMI 23.85 kg/m   Physical Exam  Constitutional: He is oriented to person, place, and time. He appears well-developed.  HENT:  Head: Atraumatic.  Neck: Neck supple.  Cardiovascular: Normal rate and intact distal pulses.   No murmur heard. No frictional rub  Pulmonary/Chest: Effort normal and breath sounds normal.  Abdominal: Soft. There is no tenderness.  Musculoskeletal: He exhibits no edema or deformity.  Neurological: He is alert and oriented to person, place, and time.  Skin: Skin is warm.  Nursing note and vitals reviewed.    ED Treatments / Results  Labs (all labs ordered are listed, but only abnormal results are displayed) Labs Reviewed  BASIC METABOLIC PANEL - Abnormal; Notable for the following:       Result Value   Potassium 3.3 (*)    Glucose, Bld 115 (*)    Calcium 8.8 (*)    All other components within normal limits  CBC - Abnormal; Notable for the following:    Hemoglobin 12.5 (*)    HCT 36.2 (*)    All other components within normal limits  [facility] SensorI-STAT TROPOININ, ED    EKG  EKG Interpretation  Date/Time:  Thursday November 19 2016 01:16:01 EDT Ventricular Rate:  67 PR Interval:  QRS Duration: 98 QT Interval:  401 QTC Calculation: 424 R Axis:   65 Text Interpretation:  Sinus rhythm ST elev, probable normal early repol pattern No acute changes No significant change since last tracing Confirmed by Derwood Kaplan [phone]) on 11/19/2016 2:33:04 AM       Radiology Dg Chest 2 View  Result Date: 11/19/2016 CLINICAL DATA:  Initial evaluation for acute chest pain. EXAM: CHEST  2 VIEW COMPARISON:  None available. FINDINGS: The cardiac and mediastinal silhouettes are within normal limits. The lungs are normally inflated. N mild patchy opacity at the medial aspect of the lung bases  bilaterally. Atelectasis is favored, although focal infiltrates could be considered in the correct clinical setting. No other focal airspace disease. No pulmonary edema or pleural effusion. No pneumothorax. No acute osseous abnormality identified. IMPRESSION: Patchy bibasilar opacities within the medial aspect of the lower lobes bilaterally, right slightly greater than left. Atelectasis is favored, although infiltrates could be considered in the correct clinical setting. Electronically Signed   By: Rise Mu M.D.   On: 11/19/2016 01:50    Procedures Procedures (including critical care time)  Medications Ordered in ED Medications - No data to display   Initial Impression / Assessment and Plan / ED Course  I have reviewed the triage vital signs and the nursing notes.  Pertinent labs & imaging results that were available during my care of the patient were reviewed by me and considered in my medical decision making (see chart for details).     Differential diagnosis includes: ACS syndrome Myocarditis Pericarditis Endocarditis Pericardial effusion / tamponade Pneumonia Pleural effusion / Pulmonary edema PE Musculoskeletal pain Esophageal spasm  Pt comes in with cc of chest pain that has been constant for > 24 hours. Pain is atypical, and is not pleuritic, positional or exertional. He has HIV hx, but denies any cough, chest pain, dib. Lung exam was completely clear and the CXR was equivocal - but clinically there is no PNA.  Labs and ekg are reassuring . WE will d/c with strict return precautions and pcp f/u.    Final Clinical Impressions(s) / ED Diagnoses   Final diagnoses:  Atypical chest pain  Precordial chest pain    New Prescriptions Discharge Medication List as of 11/19/2016  3:30 AM       Derwood Kaplan, MD 11/19/16 [phone]

## 2016-11-19 NOTE — Discharge Instructions (Signed)

## 2016-11-29 ENCOUNTER — Encounter (HOSPITAL_COMMUNITY): Payer: Self-pay | Admitting: Emergency Medicine

## 2016-11-29 ENCOUNTER — Emergency Department (HOSPITAL_COMMUNITY): Payer: Self-pay

## 2016-11-29 ENCOUNTER — Emergency Department (HOSPITAL_COMMUNITY)
Admission: EM | Admit: 2016-11-29 | Discharge: 2016-11-29 | Disposition: A | Discharge status: Home / Self Care | Payer: Self-pay | Attending: Emergency Medicine | Admitting: Emergency Medicine

## 2016-11-29 DIAGNOSIS — M25461 Effusion, right knee: Secondary | ICD-10-CM | POA: Insufficient documentation

## 2016-11-29 DIAGNOSIS — M25561 Pain in right knee: Secondary | ICD-10-CM | POA: Insufficient documentation

## 2016-11-29 DIAGNOSIS — F17210 Nicotine dependence, cigarettes, uncomplicated: Secondary | ICD-10-CM | POA: Insufficient documentation

## 2016-11-29 DIAGNOSIS — G8929 Other chronic pain: Secondary | ICD-10-CM | POA: Insufficient documentation

## 2016-11-29 MED ORDER — IBUPROFEN 800 MG PO TABS
800.0000 mg | ORAL_TABLET | Freq: Once | ORAL | Status: AC
  Administered 2016-11-29: 800 mg via ORAL
  Filled 2016-11-29: qty 1

## 2016-11-29 NOTE — ED Notes (Signed)
Acuity 4 see provider assessment 

## 2016-11-29 NOTE — ED Provider Notes (Signed)
[facility] Provider Note   CSN: [id number] Arrival date & time: 11/29/16  0113  By signing my name below, I, Phillips Climes, attest that this documentation has been prepared under the direction and in the presence of Dione Booze, MD . Electronically Signed: Phillips Climes, Scribe. 11/29/2016. 2:19 AM.   History   Chief Complaint Chief Complaint  Patient presents with  . Knee Pain   HPI Comments Aaron Stephens is a 31 y.o. male with a PMHx significant for ADHD, depression and HIV, who presents to the Emergency Department with complaints of chronic right knee pain x3 years.  Sx worse in the last day.  No recent injuries, trauma or inciting event.  No trouble with ambulation.  No numbness, weakness or tingling.  Pain worse with kneeling and walking up steps.  It is currently rated a 7/10 in severity.  Pt has not attempted any OTC symptomatic management, ice or heat before presenting today for evaluation.  He denies experiencing any other acute sx.  Pt states that he has a f/u appt scheduled with his infectious disease specialist next month.  He does not currently have a PCP.   The history is provided by the patient and medical records. No language interpreter was used.    Past Medical History:  Diagnosis Date  . ADHD (attention deficit hyperactivity disorder)     Patient Active Problem List   Diagnosis Date Noted  . Depression 04/16/2016  . Human immunodeficiency virus I infection (HCC) 03/12/2016  . Attention deficit hyperactivity disorder (ADHD) 03/12/2016    History reviewed. No pertinent surgical history.     Home Medications    Prior to Admission medications   Medication Sig Start Date End Date Taking? Authorizing Provider  elvitegravir-cobicistat-emtricitabine-tenofovir (GENVOYA) 150-150-200-10 MG TABS tablet Take 1 tablet by mouth daily. 04/16/16   Gardiner Barefoot, MD    Family History No family history on file.  Social History Social History    Substance Use Topics  . Smoking status: Current Some Day Smoker    Packs/day: 1.00    Types: Cigarettes    Start date: 06/02/2007  . Smokeless tobacco: Never Used  . Alcohol use No     Allergies   Patient has no known allergies.   Review of Systems Review of Systems  Constitutional: Negative for chills and fever.  Respiratory: Negative for shortness of breath.   Cardiovascular: Negative for chest pain.  Gastrointestinal: Negative for abdominal pain, nausea and vomiting.  Musculoskeletal: Positive for arthralgias. Negative for gait problem and myalgias.  Skin: Negative for pallor and wound.  Neurological: Negative for weakness and numbness.  All other systems reviewed and are negative.  Physical Exam Updated Vital Signs BP (!) 145/83 (BP Location: Right Arm)   Pulse 84   Temp 98.6 F (37 C) (Oral)   Resp 18   Ht 5\' 11"  (1.803 m)   Wt 175 lb (79.4 kg)   SpO2 99%   BMI 24.41 kg/m   Physical Exam  Constitutional: He is oriented to person, place, and time. He appears well-developed and well-nourished.  HENT:  Head: Normocephalic and atraumatic.  Eyes: EOM are normal. Pupils are equal, round, and reactive to light.  Neck: Normal range of motion. Neck supple. No JVD present.  Cardiovascular: Normal rate, regular rhythm and normal heart sounds.   No murmur heard. Pulmonary/Chest: Effort normal and breath sounds normal. He has no wheezes. He has no rales. He exhibits no tenderness.  Abdominal: Soft. Bowel sounds  are normal. He exhibits no distension and no mass. There is no tenderness.  Musculoskeletal:  Mild swelling right knee inferior to the patella.  Tender over patella tendon.  No effusion.  No instability. Lachman and McMurray test's are negative.  Pain elicited with extension of the knee against resistance.  Lymphadenopathy:    He has no cervical adenopathy.  Neurological: He is alert and oriented to person, place, and time. No cranial nerve deficit. He exhibits  normal muscle tone. Coordination normal.  Skin: Skin is warm and dry. No rash noted.  Psychiatric: He has a normal mood and affect. His behavior is normal. Judgment and thought content normal.  Nursing note and vitals reviewed.  ED Treatments / Results  DIAGNOSTIC STUDIES: Oxygen Saturation is 99% on room air, normal by my interpretation.    COORDINATION OF CARE: 2:13 AM Discussed treatment plan with pt at bedside and pt agreed to plan.  Radiology Dg Knee Complete 4 Views Right  Result Date: 11/29/2016 CLINICAL DATA:  Chronic right knee pain, significantly worsened last night without trauma. EXAM: RIGHT KNEE - COMPLETE 4+ VIEW COMPARISON:  None. FINDINGS: No evidence of fracture, dislocation, or joint effusion. No evidence of arthropathy or other focal bone abnormality. Soft tissues are unremarkable. IMPRESSION: Negative. Electronically Signed   By: Ellery Plunkaniel R Mitchell M.D.   On: 11/29/2016 03:21    Procedures Procedures (including critical care time)  Medications Ordered in ED Medications  ibuprofen (ADVIL,MOTRIN) tablet 800 mg (not administered)     Initial Impression / Assessment and Plan / ED Course  I have reviewed the triage vital signs and the nursing notes.  Pertinent imaging results that were available during my care of the patient were reviewed by me and considered in my medical decision making (see chart for details).  Right knee pain which seems to be localized around the patellar tendon. X-rays are obtained and show no acute injury. Old records are reviewed, and he has no relevant past visits. 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 He is advised to use over-the-counter naproxen and apply ice. He is referred to orthopedics for follow-up.  Final Clinical Impressions(s) / ED Diagnoses   Final diagnoses:  Chronic pain of right knee    New Prescriptions New Prescriptions   No medications on file   I personally performed the  services described in this documentation, which was scribed in my presence. The recorded information has been reviewed and is accurate.     Dione BoozeGlick, Jammy Plotkin, MD 11/29/16 [phone]

## 2016-11-29 NOTE — ED Triage Notes (Signed)
Pt reports R knee pain present "for a while" but worsened last night, denies injury. Ambulatory.

## 2016-11-29 NOTE — Discharge Instructions (Signed)
Apply ice several times a day. ° °Take naproxen (Aleve) two tablets at a time, twice a day. °

## 2016-12-10 ENCOUNTER — Encounter: Payer: Self-pay | Admitting: Internal Medicine

## 2016-12-10 ENCOUNTER — Ambulatory Visit (INDEPENDENT_AMBULATORY_CARE_PROVIDER_SITE_OTHER): Payer: Self-pay | Admitting: Internal Medicine

## 2016-12-10 VITALS — BP 145/77 | HR 91 | Temp 97.8°F | Ht 71.0 in | Wt 202.0 lb

## 2016-12-10 DIAGNOSIS — B20 Human immunodeficiency virus [HIV] disease: Secondary | ICD-10-CM

## 2016-12-10 DIAGNOSIS — F908 Attention-deficit hyperactivity disorder, other type: Secondary | ICD-10-CM

## 2016-12-10 MED ORDER — ELVITEG-COBIC-EMTRICIT-TENOFAF 150-150-200-10 MG PO TABS: 1.0000 | ORAL_TABLET | Freq: Every day | ORAL | 5 refills | Status: DC

## 2016-12-10 NOTE — Assessment & Plan Note (Signed)
Not requiring any medication

## 2016-12-10 NOTE — Progress Notes (Signed)
   Subjective:    Patient ID: Aaron Stephens, male    DOB: 04/04/1986, 31 y.o.   MRN: [id number]  HPI Here for follow up of HIV [facility] visit and took for 1 month but waited for a call from the pharmacy for refills and never got called.  He was unsure if he had refills.  Did not call us or the pharmacy.  He did not come back for labs.  Our RN case manager reached out to him and he did not respond.  He is interested in getting back on medicine. He did not renew ADAP by 08/29/16 but has his paperwork now.     Review of Systems  Constitutional: Negative for fatigue.  Gastrointestinal: Negative for diarrhea.  Neurological: Negative for dizziness.       Objective:   Physical Exam  Constitutional: He appears well-developed and well-nourished. No distress.  HENT:  Mouth/Throat: No oropharyngeal exudate.  Eyes: No scleral icterus.  Cardiovascular: Normal rate, regular rhythm and normal heart sounds.   No murmur heard. Skin: No rash noted.          Assessment & Plan:

## 2016-12-10 NOTE — Assessment & Plan Note (Signed)
I counseled him on being responsible for doing the ADAP paperwork and getting refills without prompting.  He will renew today and start Genvoya when ADAP is active.  He will return in 4 weeks for labs and after that with me.

## 2016-12-11 ENCOUNTER — Encounter: Payer: Self-pay | Admitting: Internal Medicine

## 2016-12-31 ENCOUNTER — Other Ambulatory Visit: Payer: Self-pay

## 2017-01-14 ENCOUNTER — Ambulatory Visit (INDEPENDENT_AMBULATORY_CARE_PROVIDER_SITE_OTHER): Payer: Self-pay | Admitting: Internal Medicine

## 2017-01-14 ENCOUNTER — Encounter: Payer: Self-pay | Admitting: Internal Medicine

## 2017-01-14 VITALS — BP 121/73 | HR 78 | Temp 98.1°F | Ht 71.0 in | Wt 203.0 lb

## 2017-01-14 DIAGNOSIS — Z113 Encounter for screening for infections with a predominantly sexual mode of transmission: Secondary | ICD-10-CM | POA: Insufficient documentation

## 2017-01-14 DIAGNOSIS — Z79899 Other long term (current) drug therapy: Secondary | ICD-10-CM

## 2017-01-14 DIAGNOSIS — Z720 Tobacco use: Secondary | ICD-10-CM | POA: Insufficient documentation

## 2017-01-14 DIAGNOSIS — B20 Human immunodeficiency virus [HIV] disease: Secondary | ICD-10-CM

## 2017-01-14 LAB — COMPLETE METABOLIC PANEL WITH GFR
ALT: 12 U/L (ref 9–46)
AST: 13 U/L (ref 10–40)
Albumin: 3.9 g/dL (ref 3.6–5.1)
Alkaline Phosphatase: 111 U/L (ref 40–115)
BUN: 10 mg/dL (ref 7–25)
CHLORIDE: 105 mmol/L (ref 98–110)
CO2: 27 mmol/L (ref 20–32)
Calcium: 8.7 mg/dL (ref 8.6–10.3)
Creat: 0.9 mg/dL (ref 0.60–1.35)
GFR, Est African American: >89 mL/min (ref >=60)
Glucose, Bld: 76 mg/dL (ref 65–99)
POTASSIUM: 4.1 mmol/L (ref 3.5–5.3)
SODIUM: 139 mmol/L (ref 135–146)
Total Bilirubin: 0.4 mg/dL (ref 0.2–1.2)
Total Protein: 6.8 g/dL (ref 6.1–8.1)

## 2017-01-14 LAB — LIPID PANEL
CHOL/HDL RATIO: 4.1 ratio (ref <5.0)
Cholesterol: 196 mg/dL (ref <200)
HDL: 48 mg/dL (ref >40)
LDL Cholesterol: 135 mg/dL — ABNORMAL HIGH (ref <100)
Triglycerides: 63 mg/dL (ref <150)
VLDL: 13 mg/dL (ref <30)

## 2017-01-14 LAB — CBC WITH DIFFERENTIAL/PLATELET
BASOS ABS: 40 {cells}/uL (ref 0–200)
Basophils Relative: 1 %
EOS PCT: 4 %
Eosinophils Absolute: 160 cells/uL (ref 15–500)
HEMATOCRIT: 38.5 % (ref 38.5–50.0)
HEMOGLOBIN: 13 g/dL — AB (ref 13.2–17.1)
LYMPHS ABS: 1280 {cells}/uL (ref 850–3900)
Lymphocytes Relative: 32 %
MCH: 29.3 pg (ref 27.0–33.0)
MCHC: 33.8 g/dL (ref 32.0–36.0)
MCV: 86.9 fL (ref 80.0–100.0)
MONO ABS: 560 {cells}/uL (ref 200–950)
MPV: 8.4 fL (ref 7.5–12.5)
Monocytes Relative: 14 %
NEUTROS ABS: 1960 {cells}/uL (ref 1500–7800)
Neutrophils Relative %: 49 %
Platelets: 205 10*3/uL (ref 140–400)
RBC: 4.43 MIL/uL (ref 4.20–5.80)
RDW: 13.9 % (ref 11.0–15.0)
WBC: 4 10*3/uL (ref 3.8–10.8)

## 2017-01-14 NOTE — Assessment & Plan Note (Signed)
counseled

## 2017-01-14 NOTE — Assessment & Plan Note (Signed)
Will screen today 

## 2017-01-14 NOTE — Assessment & Plan Note (Signed)
Lipid panel today

## 2017-01-14 NOTE — Progress Notes (Signed)
   Subjective:    Patient ID: Aaron Stephens, male    DOB: 13-Oct-1985, 31 y.o.   MRN: [id number]  HPI Here for follow up of HIV I saw him last month and was off of his medications after not renewing adap.  He restarted about 3 weeks ago and doing well.  No associated n/v/d.  No rashes. Did not get labs prior to visit. No complaints today.    Review of Systems  Constitutional: Negative for fatigue.  Skin: Negative for rash.  Neurological: Negative for dizziness.       Objective:   Physical Exam  Constitutional: He appears well-developed and well-nourished. No distress.  HENT:  Mouth/Throat: No oropharyngeal exudate.  Eyes: No scleral icterus.  Cardiovascular: Normal rate, regular rhythm and normal heart sounds.   No murmur heard. Pulmonary/Chest: Effort normal and breath sounds normal. No respiratory distress.  Skin: No rash noted.   SH: + tobacco       Assessment & Plan:

## 2017-01-14 NOTE — Assessment & Plan Note (Addendum)
Counseled to remain on medications.  Labs today and if ok, rtc 3 months.  menveo next visit

## 2017-01-15 LAB — T-HELPER CELL (CD4) - (RCID CLINIC ONLY)
CD4 % Helper T Cell: 36 % (ref 33–55)
CD4 T Cell Abs: 490 /uL (ref 400–2700)

## 2017-01-15 LAB — URINE CYTOLOGY ANCILLARY ONLY
Chlamydia: NEGATIVE
Neisseria Gonorrhea: NEGATIVE

## 2017-01-15 LAB — RPR

## 2017-01-18 LAB — HIV-1 RNA QUANT-NO REFLEX-BLD
HIV 1 RNA QUANT: 36 {copies}/mL — AB
HIV-1 RNA QUANT, LOG: 1.56 {Log_copies}/mL — AB

## 2017-01-21 ENCOUNTER — Encounter: Payer: Self-pay | Admitting: Internal Medicine

## 2017-03-13 ENCOUNTER — Emergency Department (HOSPITAL_COMMUNITY)
Admission: EM | Admit: 2017-03-13 | Discharge: 2017-03-14 | Discharge status: Against Medical Advice | Payer: Self-pay | Attending: Emergency Medicine | Admitting: Emergency Medicine

## 2017-03-13 ENCOUNTER — Emergency Department (HOSPITAL_COMMUNITY): Payer: Self-pay

## 2017-03-13 ENCOUNTER — Encounter (HOSPITAL_COMMUNITY): Payer: Self-pay | Admitting: *Deleted

## 2017-03-13 DIAGNOSIS — R079 Chest pain, unspecified: Secondary | ICD-10-CM | POA: Insufficient documentation

## 2017-03-13 DIAGNOSIS — Z5321 Procedure and treatment not carried out due to patient leaving prior to being seen by health care provider: Secondary | ICD-10-CM | POA: Insufficient documentation

## 2017-03-13 LAB — CBC
HEMATOCRIT: 39.4 % (ref 39.0–52.0)
HEMOGLOBIN: 13 g/dL (ref 13.0–17.0)
MCH: 28.7 pg (ref 26.0–34.0)
MCHC: 33 g/dL (ref 30.0–36.0)
MCV: 87 fL (ref 78.0–100.0)
Platelets: 223 10*3/uL (ref 150–400)
RBC: 4.53 MIL/uL (ref 4.22–5.81)
RDW: 13.6 % (ref 11.5–15.5)
WBC: 4.6 10*3/uL (ref 4.0–10.5)

## 2017-03-13 LAB — TROPONIN I: Troponin I: <0.03 ng/mL (ref <0.03)

## 2017-03-13 LAB — BASIC METABOLIC PANEL
Anion gap: 6 (ref 5–15)
BUN: 10 mg/dL (ref 6–20)
CALCIUM: 8.9 mg/dL (ref 8.9–10.3)
CHLORIDE: 105 mmol/L (ref 101–111)
CO2: 25 mmol/L (ref 22–32)
CREATININE: 0.78 mg/dL (ref 0.61–1.24)
GFR calc non Af Amer: >60 mL/min (ref >60)
Glucose, Bld: 92 mg/dL (ref 65–99)
Potassium: 3.5 mmol/L (ref 3.5–5.1)
Sodium: 136 mmol/L (ref 135–145)

## 2017-03-13 NOTE — ED Notes (Signed)
Pt.'s last name was called multiple times for vitals to be re-evaluated in the waiting room and has not responded.

## 2017-03-13 NOTE — ED Triage Notes (Signed)
The pt is c/o mid chest pain since yesterday no other symptoms  He hass had the same several times and was worked up for the same no cardiac involvement the last time he was here  No distress

## 2017-04-13 ENCOUNTER — Encounter: Payer: Self-pay | Admitting: Internal Medicine

## 2017-04-20 ENCOUNTER — Ambulatory Visit (INDEPENDENT_AMBULATORY_CARE_PROVIDER_SITE_OTHER): Payer: Self-pay | Admitting: Internal Medicine

## 2017-04-20 ENCOUNTER — Telehealth: Payer: Self-pay | Admitting: Licensed Clinical Social Worker

## 2017-04-20 ENCOUNTER — Encounter: Payer: Self-pay | Admitting: Internal Medicine

## 2017-04-20 VITALS — BP 114/76 | HR 76 | Temp 98.0°F | Wt 203.4 lb

## 2017-04-20 DIAGNOSIS — Z7189 Other specified counseling: Secondary | ICD-10-CM

## 2017-04-20 DIAGNOSIS — Z720 Tobacco use: Secondary | ICD-10-CM

## 2017-04-20 DIAGNOSIS — B20 Human immunodeficiency virus [HIV] disease: Secondary | ICD-10-CM

## 2017-04-20 DIAGNOSIS — Z7185 Encounter for immunization safety counseling: Secondary | ICD-10-CM | POA: Insufficient documentation

## 2017-04-20 NOTE — Assessment & Plan Note (Signed)
Doing well now.  Labs today and rtc 6 months.

## 2017-04-20 NOTE — Telephone Encounter (Signed)
[facility] and [facility] self and scheduled appointment for Tuesday.  Aaron AlbertsSherry Clayton Stephens, [facility]

## 2017-04-20 NOTE — Progress Notes (Signed)
   Subjective:    Patient ID: Aaron Stephens, male    DOB: Sep 07, 1985, 31 y.o.   MRN: [id number]  HPI Here for follow up of HIV He has been on Genvoya and denies any missed doses.  I last saw him in August and he was back on medication for 3 weeks after letting ADAP lapse.  No issues since and his viral load then was suppressed.  No associated n/v/d.  No rashes.     Review of Systems  Constitutional: Negative for fatigue.  Gastrointestinal: Negative for diarrhea.  Skin: Negative for rash.       Objective:   Physical Exam  Constitutional: He appears well-developed and well-nourished. No distress.  Eyes: No scleral icterus.  Cardiovascular: Normal rate, regular rhythm and normal heart sounds.  No murmur heard. Pulmonary/Chest: Effort normal and breath sounds normal. No respiratory distress.  Lymphadenopathy:    He has no cervical adenopathy.  Skin: No rash noted.   SH: + tobacco       Assessment & Plan:

## 2017-04-20 NOTE — Assessment & Plan Note (Signed)
Precontemplative.  counseled

## 2017-04-20 NOTE — Assessment & Plan Note (Signed)
Counseled on the flu shot, menveo.  refused

## 2017-04-21 LAB — T-HELPER CELL (CD4) - (RCID CLINIC ONLY)
CD4 T CELL HELPER: 36 % (ref 33–55)
CD4 T Cell Abs: 550 /uL (ref 400–2700)

## 2017-04-26 ENCOUNTER — Encounter: Payer: Self-pay | Admitting: Internal Medicine

## 2017-04-26 LAB — HIV-1 RNA QUANT-NO REFLEX-BLD
HIV 1 RNA Quant: 25 copies/mL — ABNORMAL HIGH
HIV-1 RNA Quant, Log: 1.4 Log copies/mL — ABNORMAL HIGH

## 2017-04-27 ENCOUNTER — Encounter: Payer: Self-pay | Admitting: Internal Medicine

## 2017-04-27 ENCOUNTER — Institutional Professional Consult (permissible substitution): Payer: Self-pay | Admitting: Licensed Clinical Social Worker

## 2017-04-27 ENCOUNTER — Telehealth: Payer: Self-pay | Admitting: Licensed Clinical Social Worker

## 2017-04-27 NOTE — Telephone Encounter (Signed)
[facility] and he stated that he could not make appointment today but rescheduled for Tuesday.  Vergia AlbertsSherry Barbar Brede, [facility]

## 2017-04-30 ENCOUNTER — Encounter: Payer: Self-pay | Admitting: Internal Medicine

## 2017-05-04 ENCOUNTER — Ambulatory Visit (INDEPENDENT_AMBULATORY_CARE_PROVIDER_SITE_OTHER): Payer: Self-pay | Admitting: Licensed Clinical Social Worker

## 2017-05-04 ENCOUNTER — Emergency Department (HOSPITAL_COMMUNITY)
Admission: EM | Admit: 2017-05-04 | Discharge: 2017-05-05 | Discharge status: Against Medical Advice | Payer: Self-pay | Attending: Emergency Medicine | Admitting: Emergency Medicine

## 2017-05-04 ENCOUNTER — Encounter (HOSPITAL_COMMUNITY): Payer: Self-pay | Admitting: Emergency Medicine

## 2017-05-04 DIAGNOSIS — Z5321 Procedure and treatment not carried out due to patient leaving prior to being seen by health care provider: Secondary | ICD-10-CM | POA: Insufficient documentation

## 2017-05-04 DIAGNOSIS — R45850 Homicidal ideations: Secondary | ICD-10-CM

## 2017-05-04 DIAGNOSIS — F322 Major depressive disorder, single episode, severe without psychotic features: Secondary | ICD-10-CM

## 2017-05-04 DIAGNOSIS — R45851 Suicidal ideations: Secondary | ICD-10-CM

## 2017-05-04 NOTE — ED Notes (Signed)
Bed: WLPT4 Expected date:  Expected time:  Means of arrival:  Comments: 

## 2017-05-04 NOTE — BHH Counselor (Signed)
Per RN pt has not responded to name being called in the waiting room and has not been seen by the EDP. Clinician expressed the TTS consult will be completed once the pt is assessed by the EDP. Clinician expressed she will call back to check in.    Redmond Pullingreylese D Almedia Cordell, MS, [facility], [facility] Triage Specialist [phone]

## 2017-05-04 NOTE — ED Triage Notes (Signed)
Per pt, states he has a lot going on-states he started feeling depressed about a month ago-denies SI/HI-states no psych history-has never been on meds

## 2017-05-04 NOTE — BHH Counselor (Signed)
Clinician checked in with Misty StanleyLisa, RN and noted the pt is still not answering to his name being called in the waiting room. Clinician reported, she will check back.    Redmond Pullingreylese D Atilano Covelli, MS, [facility], [facility] Triage Specialist [phone]

## 2017-05-04 NOTE — BH Specialist Note (Signed)
Integrated Behavioral Health Initial Visit  MRN: [id number] Name: Aaron Stephens  Number of Integrated Behavioral Health Clinician visits:: 1/6 Session Start time: 2:16 pm  Session End time: 3:00 pm Total time: 45 minutes  Type of Service: Integrated Behavioral Health- Individual/Family Interpretor:No. Interpretor Name and Language: N/A  SUBJECTIVE: Aaron CoppDarren D Kunzler is a 31 y.o. male accompanied by self Patient was referred by Dr. Luciana Axeomer for depressive symptoms.  Patient reports the following symptoms/concerns: "Mood swings" "My mind comes and goes", "I have crazy thoughts".  Patient reported current suicidal thoughts with a plan to hang self due to being overwhelmed.  Patient stated that his desire to live "flip flops".  Patient does not see options for his life now that his parents are deceased.  His mother died in 2017 and his father died in 2016 and he is still grieving.  Patient was fired from job last month and currently has an unstable living situation (couch to couch).  Additionally patient reported homicidal ideations due to his firing and stated that he wants to go back to the [employer] and fight his coworkers and "destroy the place".  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  Patient stated that he is very impulsive and acts without thinking of consequences.  Patient completed the PHQ-9 and scored a 21.  Patient denied treatment history.  Duration of problem: since Novermber; Severity of problem: severe  OBJECTIVE: Mood: Depressed and Affect: within range Risk of harm to self or others: Suicidal ideation Suicide plan to hang self Belief that plan would result in death Thoughts of violence towards others- Former co-workers at the [employer].  Believes that was fired without justification and is angry and wants to fight former co-workers.   GOALS ADDRESSED: Patient will: 1. Reduce symptoms of:  depression and suicidal and homicidal statements 2. Increase knowledge and/or ability of: coping skills and healthy habits  3. Demonstrate ability to: Increase healthy adjustment to current life circumstances, Increase adequate support systems for patient/family and decrease suicidal and homicidal thoughts   INTERVENTIONS: Interventions utilized: Solution-Focused Strategies  Standardized Assessments completed: PHQ 9   Depression screen PHQ 2/9 05/04/2017  Decreased Interest 3  Down, Depressed, Hopeless 3  PHQ - 2 Score 6  Altered sleeping 3  Tired, decreased energy 1  Change in appetite 3  Feeling bad or failure about yourself  3  Trouble concentrating 3  Moving slowly or fidgety/restless 1  Suicidal thoughts 1  PHQ-9 Score 21  Difficult doing work/chores Very difficult    ASSESSMENT: Patient currently experiencing depressive symptoms and suicidal and homicidal statements and may benefit from psychiatric assessment [facility], behavioral health services and medication management.  PLAN: 1. Patient agreed to be [facility] 2. [place] transportation was called to transport [facility] to [facility]. 3. Security office was called to be on hand to wait with patient until transportation comes.  Vergia AlbertsSherry Merion Caton, [facility]

## 2017-05-04 NOTE — ED Notes (Signed)
Pt called from lobby, no responce 

## 2017-05-04 NOTE — ED Notes (Signed)
Called  No response from lobby 

## 2017-05-04 NOTE — BHH Counselor (Signed)
Per Florentina AddisonKatie, RN pt still has not shown. Clinician asked Katie, RN to discontinue pt's TTS consult and if pt returns after he is seen by the EDP she will complete his assessment.   Redmond Pullingreylese D Zaid Tomes, MS, [facility], [facility] Triage Specialist [phone]

## 2017-05-05 ENCOUNTER — Telehealth: Payer: Self-pay | Admitting: Licensed Clinical Social Worker

## 2017-05-05 ENCOUNTER — Telehealth: Payer: Self-pay

## 2017-05-05 ENCOUNTER — Telehealth: Payer: Self-pay | Admitting: *Deleted

## 2017-05-05 NOTE — Telephone Encounter (Signed)
[name] at [phone].  No answer and hang up three times. Called hospital operator for transfer and phone rang with out answering then someone answered and hung up. I will try to find an additional contact number .  Message left previously with [name] -Assistant Director of ED to call regarding patient issue.    Aaron Stephens was [facility] [name].  When checking for follow up this morning it was noted the patient did not answer after being assessed by Charge Nurse, [name].   I have attempted to reach the ED to speak with the charge nurse and discuss what took place after the nurse assessment.   At this point I am not able to reach anyone in the [name]. I reach out to t

## 2017-05-05 NOTE — Telephone Encounter (Signed)
[facility] [place] Police to follow up on their actions with the patient.  A representative from the police stated that they did not interview the patient yesterday because he was not there by the time they arrived.  But she stated that she notified the former employer and their satellite office.  Vergia AlbertsSherry Sherion Dooly, [facility]

## 2017-05-05 NOTE — Telephone Encounter (Signed)
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  Raina stated that she would inform the Art therapistGeneral Manager and the staff.  Raina stated that the Store Manager was unavailable to speak with because he was preparing a catering order.  Location of store is [address], [place].  [facility] never disclosed name of clinic to violate privacy and only stated that she was calling from [facility].  Vergia AlbertsSherry Emmelyn Schmale, [facility]

## 2017-05-05 NOTE — Telephone Encounter (Signed)
Patient called in and asked to speak with [facility] regarding the "police looking for him".  Patient denied making homicidal statement and denied telling [facility] that he was going to go to [employer] to look for the employees that were involved in his firing.  BHC attempted to prompt the patient's memories of statements made yesterday, but the patient denied them.  Patient also denied stating that he was suicidal and reported that he did not have those thoughts anymore "of hanging himself" because "people told him not to think like that".  Patient denied that he is currently a danger to himself or others.  [facility] questioned patient about [facility] and he stated that he was seen by two nurses and was asked to wait in the waiting area and he decided to leave because of the wait.  [facility] questioned how long he waited and he stated for about 25 mins after seeing the two nurses and he stated that he did not notify anyone that he was leaving the ED.  [facility] questioned why he waited for almost 45 mins at [facility] for [facility] to pick him up to take him to be evaluated, and then left [facility] before he was evaluated and he stated that he left because it was getting dark and cold outside.  Vergia AlbertsSherry Jayin Derousse, [facility]

## 2017-05-05 NOTE — Telephone Encounter (Signed)
[facility] called patient to perform safety check and received his voicemail.  [facility] left a message requesting a return phone call.  Vergia AlbertsSherry Karlo Goeden, [facility]

## 2017-05-05 NOTE — Telephone Encounter (Signed)
[facility]  [facility] on 12/4 around 4:20 pm to inform them of homicidal statements made by patient toward his former co-workers.  [facility] was informed that they would present [facility] to interview the patient and take a statement.  Vergia AlbertsSherry Corneluis Allston, [facility]

## 2017-05-05 NOTE — Telephone Encounter (Signed)
Patient called the front desk staff, Aaron Stephens and asked to speak to "Aaron Stephens". Answered call and he stated that he received a call from [employer] and was told the sheriff dept was looking for him. He said he was told that someone said he wanted to kill someone. He said, "I never said that; why would someone say I said that". I transferred the call to the mobile phone and went to [facility] office. I did note that patient was seen by Acie FredricksonSherry Royser, counselor yesterday and I did sit with patient briefly while he was waiting for [facility]. [name] gave the call to [facility] and she spoke with patient.

## 2017-05-06 ENCOUNTER — Telehealth: Payer: Self-pay | Admitting: Licensed Clinical Social Worker

## 2017-05-06 NOTE — Telephone Encounter (Signed)
[facility] called and left message with additional mental health resources and asked patient to visit either [facility] or [facility] for walk-in assessment due to current symptoms, as evidenced by score of 21 on the PHQ-9.  [facility] left contact information with the hours for walk-in assessment.  [facility] also left crisis numbers for the patient to utilize.  Aaron AlbertsSherry Ozella Stephens, [facility]

## 2017-08-09 ENCOUNTER — Encounter (HOSPITAL_COMMUNITY): Payer: Self-pay | Admitting: Emergency Medicine

## 2017-08-09 ENCOUNTER — Emergency Department (HOSPITAL_COMMUNITY)
Admission: EM | Admit: 2017-08-09 | Discharge: 2017-08-10 | Disposition: A | Discharge status: Home / Self Care | Payer: Self-pay | Attending: Emergency Medicine | Admitting: Emergency Medicine

## 2017-08-09 ENCOUNTER — Emergency Department (HOSPITAL_COMMUNITY): Payer: Self-pay

## 2017-08-09 DIAGNOSIS — B20 Human immunodeficiency virus [HIV] disease: Secondary | ICD-10-CM | POA: Insufficient documentation

## 2017-08-09 DIAGNOSIS — F17210 Nicotine dependence, cigarettes, uncomplicated: Secondary | ICD-10-CM | POA: Insufficient documentation

## 2017-08-09 DIAGNOSIS — R05 Cough: Secondary | ICD-10-CM | POA: Insufficient documentation

## 2017-08-09 DIAGNOSIS — Z79899 Other long term (current) drug therapy: Secondary | ICD-10-CM | POA: Insufficient documentation

## 2017-08-09 DIAGNOSIS — R0789 Other chest pain: Secondary | ICD-10-CM | POA: Insufficient documentation

## 2017-08-09 LAB — CBC
HEMATOCRIT: 39.4 % (ref 39.0–52.0)
HEMOGLOBIN: 13.5 g/dL (ref 13.0–17.0)
MCH: 30.9 pg (ref 26.0–34.0)
MCHC: 34.3 g/dL (ref 30.0–36.0)
MCV: 90.2 fL (ref 78.0–100.0)
Platelets: 233 10*3/uL (ref 150–400)
RBC: 4.37 MIL/uL (ref 4.22–5.81)
RDW: 12.7 % (ref 11.5–15.5)
WBC: 7.9 10*3/uL (ref 4.0–10.5)

## 2017-08-09 LAB — BASIC METABOLIC PANEL
ANION GAP: 7 (ref 5–15)
BUN: 8 mg/dL (ref 6–20)
CHLORIDE: 104 mmol/L (ref 101–111)
CO2: 27 mmol/L (ref 22–32)
Calcium: 9 mg/dL (ref 8.9–10.3)
Creatinine, Ser: 0.87 mg/dL (ref 0.61–1.24)
GFR calc Af Amer: >60 mL/min (ref >60)
GFR calc non Af Amer: >60 mL/min (ref >60)
GLUCOSE: 111 mg/dL — AB (ref 65–99)
POTASSIUM: 4.1 mmol/L (ref 3.5–5.1)
Sodium: 138 mmol/L (ref 135–145)

## 2017-08-09 LAB — I-STAT TROPONIN, ED: Troponin i, poc: 0 ng/mL (ref 0.00–0.08)

## 2017-08-09 NOTE — ED Triage Notes (Signed)
Pt reports R sided chest pain onset today, worse on palpation. Radiates to back. Also reports cough for several days.

## 2017-08-10 MED ORDER — NAPROXEN 250 MG PO TABS
500.0000 mg | ORAL_TABLET | Freq: Once | ORAL | Status: AC
  Administered 2017-08-10: 500 mg via ORAL
  Filled 2017-08-10: qty 2

## 2017-08-10 MED ORDER — NAPROXEN 500 MG PO TABS: 500.0000 mg | ORAL_TABLET | Freq: Two times a day (BID) | ORAL | 0 refills | Status: DC

## 2017-08-10 NOTE — ED Provider Notes (Signed)
[facility] Provider Note   CSN: [id number] Arrival date & time: 08/09/17  2042     History   Chief Complaint Chief Complaint  Patient presents with  . Chest Pain  . Cough    HPI Aaron Stephens is a 32 y.o. male.  HPI  This is a 32 year old male with a history of ADHD and HIV who presents with chest pain.  Patient reports 1 day history of right-sided chest pain that wraps around to his back.  Sometimes worse with movement and breathing.  Does report a nonproductive cough.  No fevers.  Rates his pain right now at 4 out of 10.  He has not taken anything for the pain.  He states that he may have done some heavy lifting on Sunday prior to the pain starting.  He denies any shortness of breath, abdominal pain, nausea, vomiting.  He did take Zantac without any relief.  Denies any recent leg swelling, history of blood clots, long travel, hospitalization.  Past Medical History:  Diagnosis Date  . ADHD (attention deficit hyperactivity disorder)     Patient Active Problem List   Diagnosis Date Noted  . Vaccine counseling 04/20/2017  . Screening examination for venereal disease 01/14/2017  . Encounter for long-term (current) use of high-risk medication 01/14/2017  . Tobacco abuse 01/14/2017  . Depression 04/16/2016  . Human immunodeficiency virus I infection (HCC) 03/12/2016  . Attention deficit hyperactivity disorder (ADHD) 03/12/2016    History reviewed. No pertinent surgical history.     Home Medications    Prior to Admission medications   Medication Sig Start Date End Date Taking? Authorizing Provider  elvitegravir-cobicistat-emtricitabine-tenofovir (GENVOYA) 150-150-200-10 MG TABS tablet Take 1 tablet by mouth daily. 12/10/16   Comer, Belia Hemanobert W, MD  naproxen (NAPROSYN) 500 MG tablet Take 1 tablet (500 mg total) by mouth 2 (two) times daily. 08/10/17   Kellsey Sansone, Mayer Maskerourtney F, MD    Family History History reviewed. No pertinent family  history.  Social History Social History   Tobacco Use  . Smoking status: Current Some Day Smoker    Packs/day: 1.00    Types: Cigarettes    Start date: 06/02/2007  . Smokeless tobacco: Never Used  Substance Use Topics  . Alcohol use: Yes  . Drug use: No     Allergies   Patient has no known allergies.   Review of Systems Review of Systems  Constitutional: Negative for fever.  Respiratory: Negative for shortness of breath.   Cardiovascular: Positive for chest pain. Negative for leg swelling.  Gastrointestinal: Negative for abdominal pain, nausea and vomiting.  Musculoskeletal: Negative for back pain.  Skin: Negative for rash.  All other systems reviewed and are negative.    Physical Exam Updated Vital Signs BP (!) 140/96 (BP Location: Right Arm)   Pulse 71   Temp 98.3 F (36.8 C) (Oral)   Resp 18   Ht 6' (1.829 m)   Wt 83.9 kg (185 lb)   SpO2 98%   BMI 25.09 kg/m   Physical Exam  Constitutional: He is oriented to person, place, and time. He appears well-developed and well-nourished. No distress.  HENT:  Head: Normocephalic and atraumatic.  Cardiovascular: Normal rate, regular rhythm and normal heart sounds.  No murmur heard. Right-sided chest wall tenderness to palpation, no crepitus  Pulmonary/Chest: Effort normal and breath sounds normal. No respiratory distress. He has no wheezes.  Abdominal: Soft. Bowel sounds are normal. There is no tenderness. There is no rebound.  Musculoskeletal: He exhibits no edema.       Right lower leg: He exhibits no edema.       Left lower leg: He exhibits no edema.  Neurological: He is alert and oriented to person, place, and time.  Skin: Skin is warm and dry.  Psychiatric: He has a normal mood and affect.  Nursing note and vitals reviewed.    ED Treatments / Results  Labs (all labs ordered are listed, but only abnormal results are displayed) Labs Reviewed  BASIC METABOLIC PANEL - Abnormal; Notable for the following  components:      Result Value   Glucose, Bld 111 (*)    All other components within normal limits  CBC  I-STAT TROPONIN, ED    EKG  EKG Interpretation  Date/Time:  Monday August 09 2017 20:49:31 EDT Ventricular Rate:  72 PR Interval:  180 QRS Duration: 92 QT Interval:  356 QTC Calculation: 389 R Axis:   52 Text Interpretation:  Normal sinus rhythm Normal ECG Confirmed by Ross Marcus ([id number]) on 08/10/2017 1:20:51 AM       Radiology Dg Chest 2 View  Result Date: 08/09/2017 CLINICAL DATA:  Right-sided chest pain onset yesterday worsening today. EXAM: CHEST - 2 VIEW COMPARISON:  Thirteen 18 FINDINGS: The heart size and mediastinal contours are within normal limits. There is atelectasis at each lung base. No pneumonic consolidation, pulmonary edema, effusion or pneumothorax is noted. The visualized skeletal structures are unremarkable. IMPRESSION: No active cardiopulmonary disease.  Bibasilar atelectasis. Electronically Signed   By: Tollie Eth M.D.   On: 08/09/2017 21:25    Procedures Procedures (including critical care time)  Medications Ordered in ED Medications  naproxen (NAPROSYN) tablet 500 mg (not administered)     Initial Impression / Assessment and Plan / ED Course  I have reviewed the triage vital signs and the nursing notes.  Pertinent labs & imaging results that were available during my care of the patient were reviewed by me and considered in my medical decision making (see chart for details).     Patient presents with chest pain.  He is nontoxic appearing on exam and vital signs are notable for blood pressure 140/96.  He is nontoxic appearing.  EKG is normal.  Low risk for ACS with a negative troponin.  He is PERC negative.  Pain is reproducible and patient does report some recent heavy lifting.  Suspect musculoskeletal etiology.  No evidence of pneumonia or pneumothorax on chest x-ray.  Patient given naproxen.  Recommend anti-inflammatories at home for  pain.  After history, exam, and medical workup I feel the patient has been appropriately medically screened and is safe for discharge home. Pertinent diagnoses were discussed with the patient. Patient was given return precautions.   Final Clinical Impressions(s) / ED Diagnoses   Final diagnoses:  Chest wall pain    ED Discharge Orders        Ordered    naproxen (NAPROSYN) 500 MG tablet  2 times daily     08/10/17 0144       Elick Aguilera, Mayer Masker, MD 08/10/17 0147

## 2017-08-12 ENCOUNTER — Other Ambulatory Visit: Payer: Self-pay | Admitting: Internal Medicine

## 2017-08-19 ENCOUNTER — Encounter: Payer: Self-pay | Admitting: Internal Medicine

## 2017-10-18 ENCOUNTER — Encounter: Payer: Self-pay | Admitting: Internal Medicine

## 2017-10-18 ENCOUNTER — Ambulatory Visit (INDEPENDENT_AMBULATORY_CARE_PROVIDER_SITE_OTHER): Payer: Self-pay | Admitting: Internal Medicine

## 2017-10-18 ENCOUNTER — Other Ambulatory Visit: Payer: Self-pay | Admitting: Behavioral Health

## 2017-10-18 VITALS — BP 129/80 | HR 77 | Temp 98.7°F | Wt 204.0 lb

## 2017-10-18 DIAGNOSIS — B20 Human immunodeficiency virus [HIV] disease: Secondary | ICD-10-CM

## 2017-10-18 DIAGNOSIS — Z5181 Encounter for therapeutic drug level monitoring: Secondary | ICD-10-CM

## 2017-10-18 MED ORDER — ELVITEG-COBIC-EMTRICIT-TENOFAF 150-150-200-10 MG PO TABS: 1.0000 | ORAL_TABLET | Freq: Every day | ORAL | 0 refills | Status: DC

## 2017-10-18 MED ORDER — ELVITEG-COBIC-EMTRICIT-TENOFAF 150-150-200-10 MG PO TABS: 1.0000 | ORAL_TABLET | Freq: Every day | ORAL | 11 refills | Status: DC

## 2017-10-18 NOTE — Progress Notes (Signed)
   Subjective:    Patient ID: Aaron Stephens, male    DOB: [birth year], 32 y.o.   MRN: [id number]  HPI Here for follow up of HIV.  Has been on Genvoya but unfortuantely did not renew HMAP in March so has not received refills for 2 months.  He tells me he was waiting for a call.  He did not call or reach out to [place].  Same scenario occurred last year and I discussed with him then that he needs to renew every 6 months here.  No associated weight loss, diarrhea.     Review of Systems  Constitutional: Negative for fatigue.  Gastrointestinal: Negative for diarrhea and nausea.  Skin: Negative for rash.       Objective:   Physical Exam  Constitutional: He appears well-developed and well-nourished. No distress.  HENT:  Mouth/Throat: No oropharyngeal exudate.  Eyes: No scleral icterus.  Cardiovascular: Normal rate, regular rhythm and normal heart sounds.  No murmur heard. Pulmonary/Chest: Effort normal and breath sounds normal. No respiratory distress.  Lymphadenopathy:    He has no cervical adenopathy.  Skin: No rash noted.   SH: + tobacco       Assessment & Plan:

## 2017-10-18 NOTE — Assessment & Plan Note (Signed)
Worsened and I again discussed with him that he needs to keep up with his renewals and will take to our coordinator today to renew and get a temporary supply.  Labs in 5 weeks and follow up with me after that.

## 2017-10-18 NOTE — Assessment & Plan Note (Signed)
Recent creat reviewed and wnl.  No dose adjustment indicated.

## 2017-10-29 ENCOUNTER — Other Ambulatory Visit: Payer: Self-pay | Admitting: Pharmacist Clinician (PhC)/ Clinical Pharmacy Specialist

## 2017-10-29 ENCOUNTER — Encounter: Payer: Self-pay | Admitting: Internal Medicine

## 2017-10-29 DIAGNOSIS — B20 Human immunodeficiency virus [HIV] disease: Secondary | ICD-10-CM

## 2017-10-29 MED ORDER — ELVITEG-COBIC-EMTRICIT-TENOFAF 150-150-200-10 MG PO TABS: 1.0000 | ORAL_TABLET | Freq: Every day | ORAL | 1 refills | Status: DC

## 2017-10-29 NOTE — Progress Notes (Signed)
[facility]

## 2017-11-03 ENCOUNTER — Other Ambulatory Visit: Payer: Self-pay

## 2017-11-17 ENCOUNTER — Ambulatory Visit: Payer: Self-pay | Admitting: Internal Medicine

## 2017-11-24 ENCOUNTER — Other Ambulatory Visit: Payer: Self-pay

## 2017-11-24 ENCOUNTER — Encounter: Payer: Self-pay | Admitting: Internal Medicine

## 2017-11-24 DIAGNOSIS — B20 Human immunodeficiency virus [HIV] disease: Secondary | ICD-10-CM

## 2017-11-25 LAB — COMPLETE METABOLIC PANEL WITH GFR
AG Ratio: 1.4 (calc) (ref 1.0–2.5)
ALKALINE PHOSPHATASE (APISO): 95 U/L (ref 40–115)
ALT: 14 U/L (ref 9–46)
AST: 16 U/L (ref 10–40)
Albumin: 4.2 g/dL (ref 3.6–5.1)
BILIRUBIN TOTAL: 0.6 mg/dL (ref 0.2–1.2)
BUN: 9 mg/dL (ref 7–25)
CHLORIDE: 105 mmol/L (ref 98–110)
CO2: 29 mmol/L (ref 20–32)
Calcium: 9.4 mg/dL (ref 8.6–10.3)
Creat: 0.97 mg/dL (ref 0.60–1.35)
GFR, Est African American: 120 mL/min/{1.73_m2} (ref >=60)
GFR, Est Non African American: 104 mL/min/{1.73_m2} (ref >=60)
GLOBULIN: 2.9 g/dL (ref 1.9–3.7)
GLUCOSE: 109 mg/dL — AB (ref 65–99)
Potassium: 4 mmol/L (ref 3.5–5.3)
SODIUM: 139 mmol/L (ref 135–146)
TOTAL PROTEIN: 7.1 g/dL (ref 6.1–8.1)

## 2017-11-25 LAB — T-HELPER CELL (CD4) - (RCID CLINIC ONLY)
CD4 T CELL ABS: 690 /uL (ref 400–2700)
CD4 T CELL HELPER: 38 % (ref 33–55)

## 2017-11-26 ENCOUNTER — Encounter: Payer: Self-pay | Admitting: Internal Medicine

## 2017-11-26 LAB — HIV-1 RNA QUANT-NO REFLEX-BLD
HIV 1 RNA Quant: 76 copies/mL — ABNORMAL HIGH
HIV-1 RNA QUANT, LOG: 1.88 {Log_copies}/mL — AB

## 2017-11-30 ENCOUNTER — Encounter: Payer: Self-pay | Admitting: Internal Medicine

## 2017-12-01 ENCOUNTER — Ambulatory Visit: Payer: Self-pay | Admitting: Internal Medicine

## 2017-12-14 ENCOUNTER — Encounter: Payer: Self-pay | Admitting: Internal Medicine

## 2017-12-22 ENCOUNTER — Other Ambulatory Visit: Payer: Self-pay

## 2017-12-22 ENCOUNTER — Other Ambulatory Visit: Payer: Self-pay | Admitting: *Deleted

## 2017-12-22 DIAGNOSIS — Z79899 Other long term (current) drug therapy: Secondary | ICD-10-CM

## 2017-12-22 DIAGNOSIS — Z113 Encounter for screening for infections with a predominantly sexual mode of transmission: Secondary | ICD-10-CM

## 2017-12-22 DIAGNOSIS — B20 Human immunodeficiency virus [HIV] disease: Secondary | ICD-10-CM

## 2017-12-23 ENCOUNTER — Encounter: Payer: Self-pay | Admitting: Internal Medicine

## 2017-12-23 LAB — COMPLETE METABOLIC PANEL WITH GFR
AG RATIO: 1.4 (calc) (ref 1.0–2.5)
ALBUMIN MSPROF: 4.2 g/dL (ref 3.6–5.1)
ALKALINE PHOSPHATASE (APISO): 136 U/L — AB (ref 40–115)
ALT: 25 U/L (ref 9–46)
AST: 17 U/L (ref 10–40)
BUN: 11 mg/dL (ref 7–25)
CHLORIDE: 104 mmol/L (ref 98–110)
CO2: 27 mmol/L (ref 20–32)
CREATININE: 0.97 mg/dL (ref 0.60–1.35)
Calcium: 9.5 mg/dL (ref 8.6–10.3)
GFR, Est African American: 120 mL/min/{1.73_m2} (ref >=60)
GFR, Est Non African American: 104 mL/min/{1.73_m2} (ref >=60)
GLOBULIN: 3 g/dL (ref 1.9–3.7)
Glucose, Bld: 123 mg/dL — ABNORMAL HIGH (ref 65–99)
POTASSIUM: 3.9 mmol/L (ref 3.5–5.3)
SODIUM: 139 mmol/L (ref 135–146)
Total Bilirubin: 0.4 mg/dL (ref 0.2–1.2)
Total Protein: 7.2 g/dL (ref 6.1–8.1)

## 2017-12-23 LAB — CBC WITH DIFFERENTIAL/PLATELET
BASOS ABS: 50 {cells}/uL (ref 0–200)
Basophils Relative: 1 %
EOS ABS: 310 {cells}/uL (ref 15–500)
EOS PCT: 6.2 %
HEMATOCRIT: 40.9 % (ref 38.5–50.0)
HEMOGLOBIN: 14.3 g/dL (ref 13.2–17.1)
LYMPHS ABS: 1755 {cells}/uL (ref 850–3900)
MCH: 30.2 pg (ref 27.0–33.0)
MCHC: 35 g/dL (ref 32.0–36.0)
MCV: 86.3 fL (ref 80.0–100.0)
MONOS PCT: 7.8 %
MPV: 9.2 fL (ref 7.5–12.5)
NEUTROS PCT: 49.9 %
Neutro Abs: 2495 cells/uL (ref 1500–7800)
Platelets: 265 10*3/uL (ref 140–400)
RBC: 4.74 10*6/uL (ref 4.20–5.80)
RDW: 13.5 % (ref 11.0–15.0)
Total Lymphocyte: 35.1 %
WBC mixed population: 390 cells/uL (ref 200–950)
WBC: 5 10*3/uL (ref 3.8–10.8)

## 2017-12-23 LAB — LIPID PANEL
CHOL/HDL RATIO: 4.4 (calc) (ref <5.0)
CHOLESTEROL: 243 mg/dL — AB (ref <200)
HDL: 55 mg/dL (ref >40)
LDL Cholesterol (Calc): 157 mg/dL (calc) — ABNORMAL HIGH
Non-HDL Cholesterol (Calc): 188 mg/dL (calc) — ABNORMAL HIGH (ref <130)
Triglycerides: 172 mg/dL — ABNORMAL HIGH (ref <150)

## 2017-12-23 LAB — T-HELPER CELL (CD4) - (RCID CLINIC ONLY)
CD4 T CELL HELPER: 43 % (ref 33–55)
CD4 T Cell Abs: 760 /uL (ref 400–2700)

## 2017-12-23 LAB — URINE CYTOLOGY ANCILLARY ONLY
Chlamydia: NEGATIVE
Neisseria Gonorrhea: NEGATIVE

## 2017-12-23 LAB — RPR: RPR: NONREACTIVE

## 2017-12-24 LAB — HIV-1 RNA QUANT-NO REFLEX-BLD
HIV 1 RNA Quant: <20 copies/mL — AB
HIV-1 RNA Quant, Log: <1.3 Log copies/mL — AB

## 2017-12-29 ENCOUNTER — Encounter: Payer: Self-pay | Admitting: Internal Medicine

## 2018-01-17 ENCOUNTER — Ambulatory Visit: Payer: Self-pay | Admitting: Internal Medicine

## 2018-01-19 ENCOUNTER — Ambulatory Visit: Payer: Self-pay | Admitting: Internal Medicine

## 2018-01-24 ENCOUNTER — Ambulatory Visit: Payer: Self-pay | Admitting: Internal Medicine

## 2018-01-27 ENCOUNTER — Telehealth: Payer: Self-pay

## 2018-01-27 NOTE — Telephone Encounter (Signed)
Called patient regarding mychart message to reschedule missed appt from 8/26. Rescheduled patient for 9/10. Advise patient to try not to miss appointment with Dr. Luciana Axeomer or he would need to follow- up with pharmacy for next apptointment. Patient advise understanding and states he is tolerating his medication.  Aaron Stephens

## 2018-02-08 ENCOUNTER — Ambulatory Visit (INDEPENDENT_AMBULATORY_CARE_PROVIDER_SITE_OTHER): Payer: Self-pay | Admitting: Internal Medicine

## 2018-02-08 ENCOUNTER — Encounter: Payer: Self-pay | Admitting: Internal Medicine

## 2018-02-08 VITALS — BP 116/78 | HR 87 | Temp 98.3°F | Ht 71.0 in | Wt 197.1 lb

## 2018-02-08 DIAGNOSIS — Z23 Encounter for immunization: Secondary | ICD-10-CM | POA: Insufficient documentation

## 2018-02-08 DIAGNOSIS — Z7189 Other specified counseling: Secondary | ICD-10-CM

## 2018-02-08 DIAGNOSIS — Z7185 Encounter for immunization safety counseling: Secondary | ICD-10-CM | POA: Insufficient documentation

## 2018-02-08 DIAGNOSIS — Z113 Encounter for screening for infections with a predominantly sexual mode of transmission: Secondary | ICD-10-CM

## 2018-02-08 DIAGNOSIS — B20 Human immunodeficiency virus [HIV] disease: Secondary | ICD-10-CM

## 2018-02-08 DIAGNOSIS — R739 Hyperglycemia, unspecified: Secondary | ICD-10-CM

## 2018-02-08 NOTE — Assessment & Plan Note (Signed)
Staring series and will give #2 in 2 months with Menveo at nurse visit.  #3 at next visit with me.

## 2018-02-08 NOTE — Progress Notes (Signed)
   Subjective:    Patient ID: Aaron Stephens, male    DOB: [birth year], 32 y.o.   MRN: [id number]  HPI Here for follow up of HIV Missed several appointments and previously let HMAP lapse but no issues now.  Renewing today.  Labs reassuring with CD4 of 760 and viral load < 20.  LFTs, creat wnl. Glucose up to 123, non-fasting.  Cholesterol noted.  No associated n/v/d.    Review of Systems  Constitutional: Negative for fatigue.  Gastrointestinal: Negative for diarrhea.  Skin: Negative for rash.       Objective:   Physical Exam  Constitutional: He appears well-developed and well-nourished. No distress.  HENT:  Mouth/Throat: No oropharyngeal exudate.  Eyes: No scleral icterus.  Cardiovascular: Normal rate, regular rhythm and normal heart sounds.  No murmur heard. Pulmonary/Chest: Effort normal and breath sounds normal. No respiratory distress.  Skin: No rash noted.   SH: + tobacco       Assessment & Plan:

## 2018-02-08 NOTE — Assessment & Plan Note (Signed)
Counseled on flu shot, Menveo.  Given today.  Can give Menveo #2 in 2 months.

## 2018-02-08 NOTE — Assessment & Plan Note (Signed)
Screened negative 

## 2018-02-08 NOTE — Assessment & Plan Note (Signed)
Will check A1C next visit

## 2018-02-08 NOTE — Assessment & Plan Note (Signed)
Discussed Prevnar and given today 

## 2018-02-08 NOTE — Addendum Note (Signed)
Addended by: Valarie Cones on: 02/08/2018 11:30 AM   Modules accepted: Orders

## 2018-02-08 NOTE — Assessment & Plan Note (Signed)
Doing well.  Reminded to keep up on renewals. rtc 6 months, lab prior

## 2018-02-09 ENCOUNTER — Encounter: Payer: Self-pay | Admitting: Internal Medicine

## 2018-02-09 ENCOUNTER — Ambulatory Visit: Payer: Self-pay | Admitting: Internal Medicine

## 2018-04-11 ENCOUNTER — Ambulatory Visit (INDEPENDENT_AMBULATORY_CARE_PROVIDER_SITE_OTHER): Payer: Self-pay | Admitting: *Deleted

## 2018-04-11 DIAGNOSIS — B20 Human immunodeficiency virus [HIV] disease: Secondary | ICD-10-CM

## 2018-04-11 DIAGNOSIS — Z23 Encounter for immunization: Secondary | ICD-10-CM

## 2018-07-04 ENCOUNTER — Emergency Department (HOSPITAL_COMMUNITY): Payer: Self-pay

## 2018-07-04 ENCOUNTER — Emergency Department (HOSPITAL_COMMUNITY)
Admission: EM | Admit: 2018-07-04 | Discharge: 2018-07-04 | Disposition: A | Discharge status: Home / Self Care | Payer: Self-pay | Attending: Emergency Medicine | Admitting: Emergency Medicine

## 2018-07-04 ENCOUNTER — Encounter (HOSPITAL_COMMUNITY): Payer: Self-pay | Admitting: *Deleted

## 2018-07-04 DIAGNOSIS — B20 Human immunodeficiency virus [HIV] disease: Secondary | ICD-10-CM | POA: Insufficient documentation

## 2018-07-04 DIAGNOSIS — R0789 Other chest pain: Secondary | ICD-10-CM | POA: Insufficient documentation

## 2018-07-04 DIAGNOSIS — F900 Attention-deficit hyperactivity disorder, predominantly inattentive type: Secondary | ICD-10-CM | POA: Insufficient documentation

## 2018-07-04 DIAGNOSIS — M546 Pain in thoracic spine: Secondary | ICD-10-CM | POA: Insufficient documentation

## 2018-07-04 DIAGNOSIS — Z79899 Other long term (current) drug therapy: Secondary | ICD-10-CM | POA: Insufficient documentation

## 2018-07-04 DIAGNOSIS — F17210 Nicotine dependence, cigarettes, uncomplicated: Secondary | ICD-10-CM | POA: Insufficient documentation

## 2018-07-04 LAB — CBC
HCT: 40.9 % (ref 39.0–52.0)
Hemoglobin: 13.4 g/dL (ref 13.0–17.0)
MCH: 29.6 pg (ref 26.0–34.0)
MCHC: 32.8 g/dL (ref 30.0–36.0)
MCV: 90.5 fL (ref 80.0–100.0)
Platelets: 250 10*3/uL (ref 150–400)
RBC: 4.52 MIL/uL (ref 4.22–5.81)
RDW: 12.2 % (ref 11.5–15.5)
WBC: 4.9 10*3/uL (ref 4.0–10.5)
nRBC: 0 % (ref 0.0–0.2)

## 2018-07-04 LAB — BASIC METABOLIC PANEL
Anion gap: 6 (ref 5–15)
BUN: 11 mg/dL (ref 6–20)
CO2: 27 mmol/L (ref 22–32)
Calcium: 8.9 mg/dL (ref 8.9–10.3)
Chloride: 107 mmol/L (ref 98–111)
Creatinine, Ser: 0.83 mg/dL (ref 0.61–1.24)
GFR calc Af Amer: >60 mL/min (ref >60)
GFR calc non Af Amer: >60 mL/min (ref >60)
GLUCOSE: 106 mg/dL — AB (ref 70–99)
Potassium: 4.2 mmol/L (ref 3.5–5.1)
Sodium: 140 mmol/L (ref 135–145)

## 2018-07-04 LAB — I-STAT TROPONIN, ED: Troponin i, poc: 0 ng/mL (ref 0.00–0.08)

## 2018-07-04 MED ORDER — SODIUM CHLORIDE 0.9% FLUSH: 3.0000 mL | Freq: Once | INTRAVENOUS | Status: DC

## 2018-07-04 NOTE — ED Triage Notes (Signed)
Pt in c/o right sided chest and back pain, worse with movement, no distress noted, denies shortness of breath

## 2018-07-04 NOTE — ED Provider Notes (Signed)
[facility] EMERGENCY DEPARTMENT Provider Note   CSN: [id number] Arrival date & time: 07/04/18  1022     History   Chief Complaint Chief Complaint  Patient presents with  . Back Pain  . Chest Pain    HPI Aaron Stephens is a 33 y.o. male.  HPI   33 year old male presents today with complaints of right-sided chest pain.  Patient notes he woke up this morning with a soreness in the right anterior chest wall and right posterior back and scapular region.  He notes this is worse with movements of the torso, worse with palpation of both the chest and the back.  He denies any shortness of breath, cough, fever.  No history DVT/PE or any significant risk factors for the same.  No medications prior to arrival.  Patient notes that in his job he does heavy lifting but is uncertain if he has been lifting anything heavy recently.  Past Medical History:  Diagnosis Date  . ADHD (attention deficit hyperactivity disorder)     Patient Active Problem List   Diagnosis Date Noted  . Need for prophylactic vaccination against Streptococcus pneumoniae (pneumococcus) 02/08/2018  . HPV vaccine counseling 02/08/2018  . Hyperglycemia 02/08/2018  . Medication monitoring encounter 10/18/2017  . Vaccine counseling 04/20/2017  . Screening examination for venereal disease 01/14/2017  . Encounter for long-term (current) use of high-risk medication 01/14/2017  . Tobacco abuse 01/14/2017  . Depression 04/16/2016  . Human immunodeficiency virus I infection (HCC) 03/12/2016  . Attention deficit hyperactivity disorder (ADHD) 03/12/2016    History reviewed. No pertinent surgical history.      Home Medications    Prior to Admission medications   Medication Sig Start Date End Date Taking? Authorizing Provider  elvitegravir-cobicistat-emtricitabine-tenofovir (GENVOYA) 150-150-200-10 MG TABS tablet Take 1 tablet by mouth daily. 10/18/17   Gardiner Barefoot, MD    elvitegravir-cobicistat-emtricitabine-tenofovir (GENVOYA) 150-150-200-10 MG TABS tablet Take 1 tablet by mouth daily with breakfast. 10/29/17   Comer, Belia Heman, MD  naproxen (NAPROSYN) 500 MG tablet Take 1 tablet (500 mg total) by mouth 2 (two) times daily. 08/10/17   Horton, Mayer Masker, MD    Family History History reviewed. No pertinent family history.  Social History Social History   Tobacco Use  . Smoking status: Current Some Day Smoker    Packs/day: 1.00    Years: 9.00    Pack years: 9.00    Types: Cigarettes    Start date: 06/02/2007  . Smokeless tobacco: Never Used  Substance Use Topics  . Alcohol use: Yes  . Drug use: No     Allergies   Patient has no known allergies.   Review of Systems Review of Systems  All other systems reviewed and are negative.    Physical Exam Updated Vital Signs BP 123/82 (BP Location: Left Arm)   Pulse 68   Temp 97.8 F (36.6 C) (Oral)   Resp 16   SpO2 98%   Physical Exam Vitals signs and nursing note reviewed.  Constitutional:      Appearance: He is well-developed.  HENT:     Head: Normocephalic and atraumatic.  Eyes:     General: No scleral icterus.       Right eye: No discharge.        Left eye: No discharge.     Conjunctiva/sclera: Conjunctivae normal.     Pupils: Pupils are equal, round, and reactive to light.  Neck:     Musculoskeletal: Normal range of motion.  Vascular: No JVD.     Trachea: No tracheal deviation.  Cardiovascular:     Rate and Rhythm: Normal rate and regular rhythm.     Heart sounds: No murmur.  Pulmonary:     Effort: Pulmonary effort is normal.     Breath sounds: No stridor.     Comments: Tenderness to palpation of right lateral superior anterior chest wall -no rashes noted-lung sounds clear throughout Musculoskeletal:        General: No swelling.     Right lower leg: No edema.     Left lower leg: No edema.     Comments: Tenderness palpation of the right trapezius and scapular region   Neurological:     Mental Status: He is alert and oriented to person, place, and time.     Coordination: Coordination normal.  Psychiatric:        Behavior: Behavior normal.        Thought Content: Thought content normal.        Judgment: Judgment normal.      ED Treatments / Results  Labs (all labs ordered are listed, but only abnormal results are displayed) Labs Reviewed  BASIC METABOLIC PANEL - Abnormal; Notable for the following components:      Result Value   Glucose, Bld 106 (*)    All other components within normal limits  CBC  I-STAT TROPONIN, ED    EKG None  Radiology Dg Chest 2 View  Result Date: 07/04/2018 CLINICAL DATA:  Chest pain. EXAM: CHEST - 2 VIEW COMPARISON:  08/09/2017 FINDINGS: The heart size and pulmonary vascularity are normal. Minimal atelectasis or scarring at the right lung base. Lungs are otherwise clear. Bones are normal. No effusions. IMPRESSION: Minimal scarring or atelectasis at the right lung base. Electronically Signed   By: Francene BoyersJames  Maxwell M.D.   On: 07/04/2018 11:22    Procedures Procedures (including critical care time)  Medications Ordered in ED Medications  sodium chloride flush (NS) 0.9 % injection 3 mL (has no administration in time range)     Initial Impression / Assessment and Plan / ED Course  I have reviewed the triage vital signs and the nursing notes.  Pertinent labs & imaging results that were available during my care of the patient were reviewed by me and considered in my medical decision making (see chart for details).     [age] year old male presents today with likely musculoskeletal chest wall pain.  Pain is reproducible on exam, no signs of PE, ACS, or any other significant life-threatening etiology.  Patient discharged with strict return cautions, return immediately if any new or worsening signs or symptoms present.  He verbalized understanding and agreement to today's plan had no further questions or  concerns.    Final Clinical Impressions(s) / ED Diagnoses   Final diagnoses:  Chest wall pain    ED Discharge Orders    None       Eyvonne MechanicHedges, Jovani Flury, PA-C 07/04/18 1320    Margarita Grizzleay, Danielle, MD 07/08/18 1550

## 2018-07-04 NOTE — Discharge Instructions (Addendum)
Please read attached information. If you experience any new or worsening signs or symptoms please return to the emergency room for evaluation. Please follow-up with your primary care provider or specialist as discussed.  °

## 2018-07-04 NOTE — ED Notes (Signed)
Patient verbalizes understanding of discharge instructions. Opportunity for questioning and answers were provided. Armband removed by staff, pt discharged from ED.  

## 2018-07-04 NOTE — ED Notes (Signed)
Unable to locate x 3

## 2018-07-04 NOTE — ED Notes (Signed)
Pt called to be roomed; no reply at this time

## 2018-07-05 ENCOUNTER — Encounter: Payer: Self-pay | Admitting: Internal Medicine

## 2018-08-01 ENCOUNTER — Other Ambulatory Visit: Payer: Self-pay

## 2018-08-01 DIAGNOSIS — R739 Hyperglycemia, unspecified: Secondary | ICD-10-CM

## 2018-08-01 DIAGNOSIS — B20 Human immunodeficiency virus [HIV] disease: Secondary | ICD-10-CM

## 2018-08-02 LAB — T-HELPER CELL (CD4) - (RCID CLINIC ONLY)
CD4 % Helper T Cell: 43 % (ref 33–55)
CD4 T Cell Abs: 920 /uL (ref 400–2700)

## 2018-08-03 LAB — HIV-1 RNA QUANT-NO REFLEX-BLD
HIV 1 RNA Quant: <20 copies/mL — AB
HIV-1 RNA Quant, Log: <1.3 Log copies/mL — AB

## 2018-08-03 LAB — HEMOGLOBIN A1C
Hgb A1c MFr Bld: 5.5 % of total Hgb (ref <5.7)
Mean Plasma Glucose: 111 (calc)
eAG (mmol/L): 6.2 (calc)

## 2018-08-15 ENCOUNTER — Encounter: Payer: Self-pay | Admitting: Internal Medicine

## 2018-08-16 ENCOUNTER — Ambulatory Visit (INDEPENDENT_AMBULATORY_CARE_PROVIDER_SITE_OTHER): Payer: Self-pay | Admitting: Internal Medicine

## 2018-08-16 ENCOUNTER — Encounter: Payer: Self-pay | Admitting: Internal Medicine

## 2018-08-16 ENCOUNTER — Other Ambulatory Visit: Payer: Self-pay

## 2018-08-16 VITALS — BP 112/74 | HR 76 | Temp 97.6°F | Wt 203.0 lb

## 2018-08-16 DIAGNOSIS — Z113 Encounter for screening for infections with a predominantly sexual mode of transmission: Secondary | ICD-10-CM

## 2018-08-16 DIAGNOSIS — Z23 Encounter for immunization: Secondary | ICD-10-CM

## 2018-08-16 DIAGNOSIS — Z7189 Other specified counseling: Secondary | ICD-10-CM

## 2018-08-16 DIAGNOSIS — B20 Human immunodeficiency virus [HIV] disease: Secondary | ICD-10-CM

## 2018-08-16 DIAGNOSIS — Z7185 Encounter for immunization safety counseling: Secondary | ICD-10-CM

## 2018-08-16 DIAGNOSIS — Z79899 Other long term (current) drug therapy: Secondary | ICD-10-CM

## 2018-08-16 NOTE — Addendum Note (Signed)
Addended by: Lorenso Courier on: 08/16/2018 11:27 AM   Modules accepted: Orders

## 2018-08-16 NOTE — Assessment & Plan Note (Signed)
Counseled on #3 today

## 2018-08-16 NOTE — Assessment & Plan Note (Signed)
Doing well, no changes.  rtc 6 months.  

## 2018-08-16 NOTE — Progress Notes (Signed)
   Subjective:    Patient ID: Aaron Stephens, male    DOB: 1985-09-21, 33 y.o.   MRN: [id number]  HPI Here for follow up of HIV He continues on Genvoya and denies any missed doses.  Labs reassuring with CD4 of 920 and viral load < 20. No associated n/v/d.    Review of Systems  Constitutional: Negative for fatigue.  Gastrointestinal: Negative for diarrhea.  Skin: Negative for rash.       Objective:   Physical Exam Constitutional:      General: He is not in acute distress.    Appearance: He is well-developed.  Eyes:     General: No scleral icterus. Cardiovascular:     Rate and Rhythm: Normal rate and regular rhythm.     Heart sounds: Normal heart sounds. No murmur.  Pulmonary:     Effort: Pulmonary effort is normal. No respiratory distress.     Breath sounds: Normal breath sounds.  Skin:    Findings: No rash.    SH: + tobacco       Assessment & Plan:

## 2018-10-21 ENCOUNTER — Other Ambulatory Visit: Payer: Self-pay

## 2018-10-21 ENCOUNTER — Encounter (HOSPITAL_COMMUNITY): Payer: Self-pay | Admitting: *Deleted

## 2018-10-21 ENCOUNTER — Ambulatory Visit (HOSPITAL_COMMUNITY)
Admission: EM | Admit: 2018-10-21 | Discharge: 2018-10-21 | Disposition: A | Discharge status: Home / Self Care | Payer: Self-pay | Attending: Family Medicine | Admitting: Family Medicine

## 2018-10-21 DIAGNOSIS — B309 Viral conjunctivitis, unspecified: Secondary | ICD-10-CM

## 2018-10-21 NOTE — ED Provider Notes (Signed)
[facility]    CSN: [id number] Arrival date & time: 10/21/18  1825     History   Chief Complaint Chief Complaint  Patient presents with  . Headache  . Weakness    HPI Aaron Stephens is a 33 y.o. male.   He is presenting with a pink eye.  This is been ongoing for a day or 2.  He denies any trauma or debris into his eye.  He does suffer from allergies.  Denies any fevers or chills.  He has not taken anything for his symptoms.  He has not been exposed anyone with similar symptoms.  Is not any traveling.  Has been practicing does social distancing at work.  HPI  Past Medical History:  Diagnosis Date  . ADHD (attention deficit hyperactivity disorder)     Patient Active Problem List   Diagnosis Date Noted  . Need for prophylactic vaccination against Streptococcus pneumoniae (pneumococcus) 02/08/2018  . HPV vaccine counseling 02/08/2018  . Hyperglycemia 02/08/2018  . Medication monitoring encounter 10/18/2017  . Vaccine counseling 04/20/2017  . Screening examination for venereal disease 01/14/2017  . Encounter for long-term (current) use of high-risk medication 01/14/2017  . Tobacco abuse 01/14/2017  . Depression 04/16/2016  . Human immunodeficiency virus I infection (HCC) 03/12/2016  . Attention deficit hyperactivity disorder (ADHD) 03/12/2016    History reviewed. No pertinent surgical history.     Home Medications    Prior to Admission medications   Medication Sig Start Date End Date Taking? Authorizing Provider  elvitegravir-cobicistat-emtricitabine-tenofovir (GENVOYA) 150-150-200-10 MG TABS tablet Take 1 tablet by mouth daily with breakfast. 10/29/17  Yes Comer, Belia Heman, MD  elvitegravir-cobicistat-emtricitabine-tenofovir (GENVOYA) 150-150-200-10 MG TABS tablet Take 1 tablet by mouth daily. 10/18/17   Comer, Belia Heman, MD  naproxen (NAPROSYN) 500 MG tablet Take 1 tablet (500 mg total) by mouth 2 (two) times daily. Patient not taking: Reported on 08/16/2018  08/10/17   Horton, Mayer Masker, MD    Family History Family History  Problem Relation Age of Onset  . Hypertension Mother   . Sickle cell anemia Mother   . Heart failure Mother   . Hypertension Father     Social History Social History   Tobacco Use  . Smoking status: Current Every Day Smoker    Packs/day: 1.00    Years: 9.00    Pack years: 9.00    Types: Cigarettes    Start date: 06/02/2007  . Smokeless tobacco: Never Used  . Tobacco comment: 07/02/2018  Substance Use Topics  . Alcohol use: Not Currently  . Drug use: No     Allergies   Patient has no known allergies.   Review of Systems Review of Systems  Constitutional: Negative for fever.  HENT: Negative for congestion.   Eyes: Positive for redness.  Respiratory: Negative for cough.   Cardiovascular: Negative for chest pain.  Gastrointestinal: Negative for abdominal pain.  Musculoskeletal: Negative for gait problem.  Skin: Negative for color change.  Neurological: Negative for weakness.  Hematological: Negative for adenopathy.     Physical Exam Triage Vital Signs ED Triage Vitals  Enc Vitals Group     BP 10/21/18 1901 123/74     Pulse Rate 10/21/18 1901 72     Resp 10/21/18 1901 16     Temp 10/21/18 1901 98.2 F (36.8 C)     Temp Source 10/21/18 1901 Oral     SpO2 10/21/18 1901 98 %     Weight --  Height --      Head Circumference --      Peak Flow --      Pain Score 10/21/18 1903 0     Pain Loc --      Pain Edu? --      Excl. in GC? --    No data found.  Updated Vital Signs BP 123/74   Pulse 72   Temp 98.2 F (36.8 C) (Oral)   Resp 16   SpO2 98%   Visual Acuity Right Eye Distance: 20/30 -1 Left Eye Distance: 20/20 -1 Bilateral Distance: 20/20 -1  Right Eye Near:   Left Eye Near:    Bilateral Near:     Physical Exam Gen: NAD, alert, cooperative with exam, well-appearing ENT: normal lips, normal nasal mucosa, tympanic membranes clear and intact bilaterally Eye: normal EOM,  injected right eye, normal left eye CV:  no edema, +2 pedal pulses,   Resp: no accessory muscle use, non-labored Skin: no rashes, no areas of induration  Neuro: normal tone, normal sensation to touch Psych:  normal insight, alert and oriented MSK: Normal gait, normal strength    UC Treatments / Results  Labs (all labs ordered are listed, but only abnormal results are displayed) Labs Reviewed - No data to display  EKG None  Radiology No results found.  Procedures Procedures (including critical care time)  Medications Ordered in UC Medications - No data to display  Initial Impression / Assessment and Plan / UC Course  I have reviewed the triage vital signs and the nursing notes.  Pertinent labs & imaging results that were available during my care of the patient were reviewed by me and considered in my medical decision making (see chart for details).     Aaron Stephens is a [age] year old male that is presenting with an injected left eye.  Appears to be related to either allergic or viral conjunctivitis.  Counseled on supportive care.  Provided work note to return.  Given indications to follow-up and return.  Final Clinical Impressions(s) / UC Diagnoses   Final diagnoses:  Viral conjunctivitis     Discharge Instructions     Please monitor your symptoms  Please return if your symptoms fail to improve.      ED Prescriptions    None     Controlled Substance Prescriptions [place] Controlled Substance Registry consulted? Not Applicable   Myra RudeSchmitz, Jeremy E, MD 10/21/18 2324

## 2018-10-21 NOTE — Discharge Instructions (Addendum)
Please monitor your symptoms  Please return if your symptoms fail to improve.

## 2018-10-21 NOTE — ED Triage Notes (Signed)
States needs work note; c/o HA and weakness, right eye redness with drainage.  Denies any vision changes. Denies fevers.

## 2018-11-11 IMAGING — CR DG CHEST 2V
2 series · 2 of 2 positions shown · non-contrast
Comparison: Radiographs November 19, 2016.

CLINICAL DATA: Chest pain.

EXAM:
CHEST  2 VIEW

[chest pa]
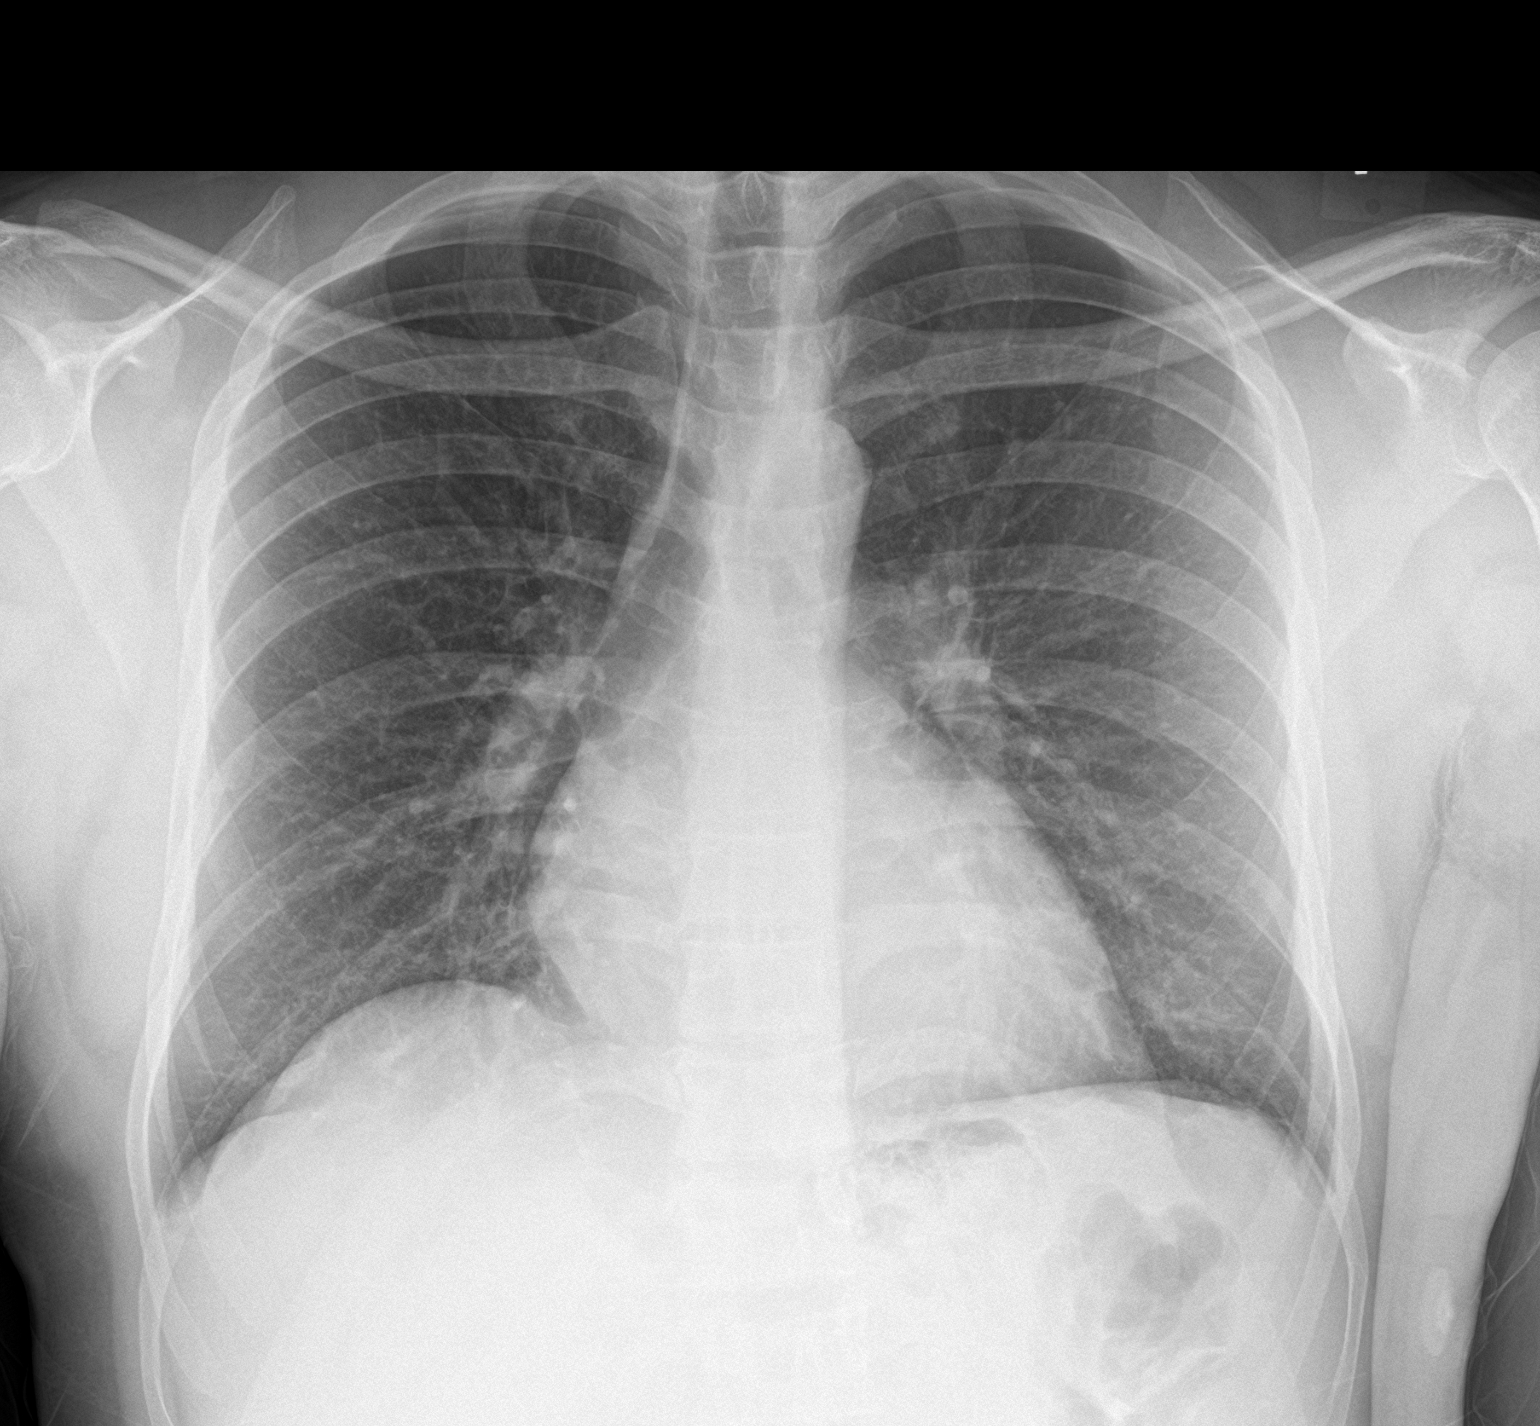

[chest lat]
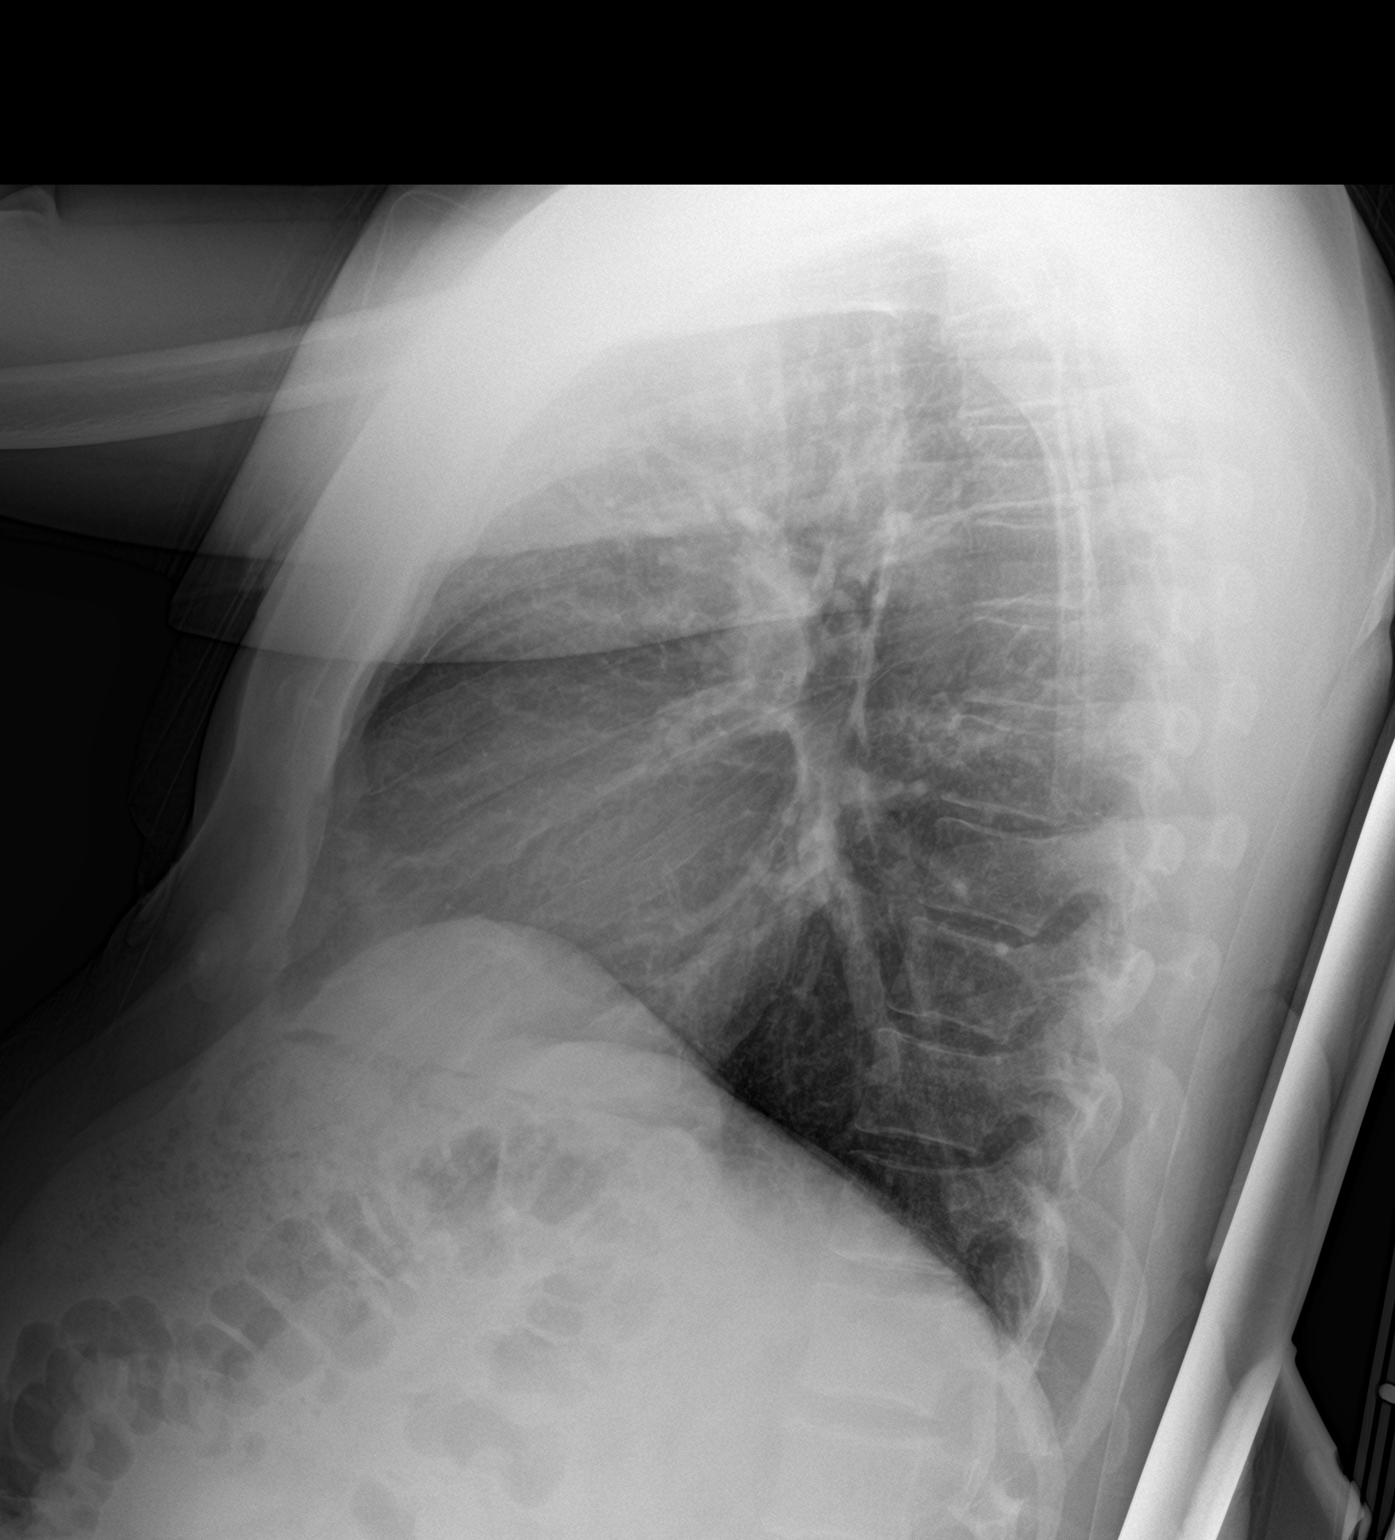

[2 of 2 positions shown; findings below may reference images not displayed]

FINDINGS: The heart size and mediastinal contours are within normal limits.
Both lungs are clear. No pneumothorax or pleural effusion is noted.
The visualized skeletal structures are unremarkable.
IMPRESSION: No active cardiopulmonary disease.

## 2018-12-14 ENCOUNTER — Other Ambulatory Visit: Payer: Self-pay | Admitting: Internal Medicine

## 2018-12-20 ENCOUNTER — Other Ambulatory Visit: Payer: Self-pay

## 2018-12-20 ENCOUNTER — Ambulatory Visit: Payer: Medicaid Other

## 2019-02-01 ENCOUNTER — Encounter: Payer: Self-pay | Admitting: Internal Medicine

## 2019-02-09 ENCOUNTER — Other Ambulatory Visit (HOSPITAL_COMMUNITY)
Admission: RE | Admit: 2019-02-09 | Discharge: 2019-02-09 | Disposition: A | Discharge status: Home / Self Care | Payer: Medicaid Other | Source: Ambulatory Visit | Attending: Internal Medicine | Admitting: Internal Medicine

## 2019-02-09 ENCOUNTER — Other Ambulatory Visit: Payer: Self-pay

## 2019-02-09 ENCOUNTER — Other Ambulatory Visit: Payer: Medicaid Other

## 2019-02-09 DIAGNOSIS — B20 Human immunodeficiency virus [HIV] disease: Secondary | ICD-10-CM | POA: Diagnosis present

## 2019-02-09 DIAGNOSIS — Z113 Encounter for screening for infections with a predominantly sexual mode of transmission: Secondary | ICD-10-CM

## 2019-02-09 DIAGNOSIS — Z79899 Other long term (current) drug therapy: Secondary | ICD-10-CM

## 2019-02-10 LAB — T-HELPER CELL (CD4) - (RCID CLINIC ONLY)
CD4 % Helper T Cell: 48 % (ref 33–65)
CD4 T Cell Abs: 1070 /uL (ref 400–1790)

## 2019-02-11 LAB — URINE CYTOLOGY ANCILLARY ONLY
Chlamydia: NEGATIVE
Neisseria Gonorrhea: NEGATIVE

## 2019-02-15 LAB — CBC WITH DIFFERENTIAL/PLATELET
Absolute Monocytes: 498 cells/uL (ref 200–950)
Basophils Absolute: 60 cells/uL (ref 0–200)
Basophils Relative: 1 %
Eosinophils Absolute: 402 cells/uL (ref 15–500)
Eosinophils Relative: 6.7 %
HCT: 41 % (ref 38.5–50.0)
Hemoglobin: 14 g/dL (ref 13.2–17.1)
Lymphs Abs: 2184 cells/uL (ref 850–3900)
MCH: 30.6 pg (ref 27.0–33.0)
MCHC: 34.1 g/dL (ref 32.0–36.0)
MCV: 89.7 fL (ref 80.0–100.0)
MPV: 9.5 fL (ref 7.5–12.5)
Monocytes Relative: 8.3 %
Neutro Abs: 2856 cells/uL (ref 1500–7800)
Neutrophils Relative %: 47.6 %
Platelets: 282 10*3/uL (ref 140–400)
RBC: 4.57 10*6/uL (ref 4.20–5.80)
RDW: 13.3 % (ref 11.0–15.0)
Total Lymphocyte: 36.4 %
WBC: 6 10*3/uL (ref 3.8–10.8)

## 2019-02-15 LAB — COMPLETE METABOLIC PANEL WITH GFR
AG Ratio: 1.4 (calc) (ref 1.0–2.5)
ALT: 18 U/L (ref 9–46)
AST: 16 U/L (ref 10–40)
Albumin: 4.2 g/dL (ref 3.6–5.1)
Alkaline phosphatase (APISO): 112 U/L (ref 36–130)
BUN: 8 mg/dL (ref 7–25)
CO2: 29 mmol/L (ref 20–32)
Calcium: 9.3 mg/dL (ref 8.6–10.3)
Chloride: 103 mmol/L (ref 98–110)
Creat: 0.97 mg/dL (ref 0.60–1.35)
GFR, Est African American: 119 mL/min/{1.73_m2} (ref >=60)
GFR, Est Non African American: 103 mL/min/{1.73_m2} (ref >=60)
Globulin: 2.9 g/dL (calc) (ref 1.9–3.7)
Glucose, Bld: 132 mg/dL — ABNORMAL HIGH (ref 65–99)
Potassium: 4.1 mmol/L (ref 3.5–5.3)
Sodium: 139 mmol/L (ref 135–146)
Total Bilirubin: 0.5 mg/dL (ref 0.2–1.2)
Total Protein: 7.1 g/dL (ref 6.1–8.1)

## 2019-02-15 LAB — HIV-1 RNA QUANT-NO REFLEX-BLD
HIV 1 RNA Quant: <20 copies/mL
HIV-1 RNA Quant, Log: <1.3 Log copies/mL

## 2019-02-15 LAB — LIPID PANEL
Cholesterol: 217 mg/dL — ABNORMAL HIGH (ref <200)
HDL: 49 mg/dL (ref >=40)
LDL Cholesterol (Calc): 138 mg/dL (calc) — ABNORMAL HIGH
Non-HDL Cholesterol (Calc): 168 mg/dL (calc) — ABNORMAL HIGH (ref <130)
Total CHOL/HDL Ratio: 4.4 (calc) (ref <5.0)
Triglycerides: 166 mg/dL — ABNORMAL HIGH (ref <150)

## 2019-02-15 LAB — RPR: RPR Ser Ql: NONREACTIVE

## 2019-02-22 ENCOUNTER — Encounter: Payer: Self-pay | Admitting: Internal Medicine

## 2019-03-13 ENCOUNTER — Encounter: Payer: Self-pay | Admitting: Internal Medicine

## 2019-03-13 ENCOUNTER — Ambulatory Visit (INDEPENDENT_AMBULATORY_CARE_PROVIDER_SITE_OTHER): Payer: Self-pay | Admitting: Internal Medicine

## 2019-03-13 ENCOUNTER — Other Ambulatory Visit (HOSPITAL_COMMUNITY)
Admission: RE | Admit: 2019-03-13 | Discharge: 2019-03-13 | Disposition: A | Discharge status: Home / Self Care | Payer: Medicaid Other | Source: Ambulatory Visit | Attending: Internal Medicine | Admitting: Internal Medicine

## 2019-03-13 ENCOUNTER — Other Ambulatory Visit: Payer: Self-pay

## 2019-03-13 VITALS — BP 116/77 | HR 74 | Temp 98.0°F

## 2019-03-13 DIAGNOSIS — Z720 Tobacco use: Secondary | ICD-10-CM

## 2019-03-13 DIAGNOSIS — B20 Human immunodeficiency virus [HIV] disease: Secondary | ICD-10-CM | POA: Diagnosis present

## 2019-03-13 DIAGNOSIS — Z79899 Other long term (current) drug therapy: Secondary | ICD-10-CM

## 2019-03-13 DIAGNOSIS — Z23 Encounter for immunization: Secondary | ICD-10-CM

## 2019-03-13 DIAGNOSIS — Z113 Encounter for screening for infections with a predominantly sexual mode of transmission: Secondary | ICD-10-CM

## 2019-03-13 DIAGNOSIS — R739 Hyperglycemia, unspecified: Secondary | ICD-10-CM

## 2019-03-13 NOTE — Assessment & Plan Note (Signed)
Up and associated with polyuria.  Will check an A1C, UA for glucose and help he get established with a PCP

## 2019-03-13 NOTE — Assessment & Plan Note (Signed)
Lipid panel noted, some elevation.  If diabetes confirmed, he will definitely need treatment. Will defer to him getting a PCP

## 2019-03-13 NOTE — Assessment & Plan Note (Signed)
Doing well, suppressed virus.  rtc 6 months 

## 2019-03-13 NOTE — Assessment & Plan Note (Signed)
Will screen today with increased urination

## 2019-03-13 NOTE — Progress Notes (Signed)
   Subjective:    Patient ID: Aaron Stephens, male    DOB: 15-Oct-1985, 33 y.o.   MRN: [id number]  HPI Here for follow up of HIV CD4 of 1,070, viral load < 20.  No missed doses. Creat wnl.  No associated n/v/d.  No rashes.  He has noticed frequency of urination despite not drinking a lot.     Review of Systems  Constitutional: Negative for activity change.  Gastrointestinal: Negative for nausea.  Endocrine: Positive for polyuria.  Skin: Negative for rash.  Neurological: Negative for dizziness.       Objective:   Physical Exam Constitutional:      General: He is not in acute distress.    Appearance: He is well-developed.  Eyes:     General: No scleral icterus. Cardiovascular:     Rate and Rhythm: Normal rate and regular rhythm.     Heart sounds: Normal heart sounds. No murmur.  Pulmonary:     Effort: Pulmonary effort is normal.  Skin:    Findings: No rash.  Neurological:     General: No focal deficit present.  Psychiatric:        Mood and Affect: Mood normal.    SH: + tobacco       Assessment & Plan:

## 2019-03-13 NOTE — Assessment & Plan Note (Signed)
precontemplative. 

## 2019-03-14 LAB — HEPATITIS A ANTIBODY, TOTAL: Hepatitis A AB,Total: NONREACTIVE

## 2019-03-14 LAB — HEMOGLOBIN A1C
Hgb A1c MFr Bld: 5.5 % of total Hgb (ref <5.7)
Mean Plasma Glucose: 111 (calc)
eAG (mmol/L): 6.2 (calc)

## 2019-03-14 LAB — HEPATITIS B SURFACE ANTIBODY,QUALITATIVE: Hep B S Ab: NONREACTIVE

## 2019-03-14 LAB — HEPATITIS B SURFACE ANTIGEN: Hepatitis B Surface Ag: NONREACTIVE

## 2019-03-17 LAB — URINE CYTOLOGY ANCILLARY ONLY
Chlamydia: NEGATIVE
Comment: NEGATIVE
Comment: NORMAL
Neisseria Gonorrhea: NEGATIVE

## 2019-04-09 IMAGING — CR DG CHEST 2V
2 series · 2 of 2 positions shown · non-contrast
Comparison: Thirteen 18

CLINICAL DATA: Right-sided chest pain onset yesterday worsening
today.

EXAM:
CHEST - 2 VIEW

[chest pa]
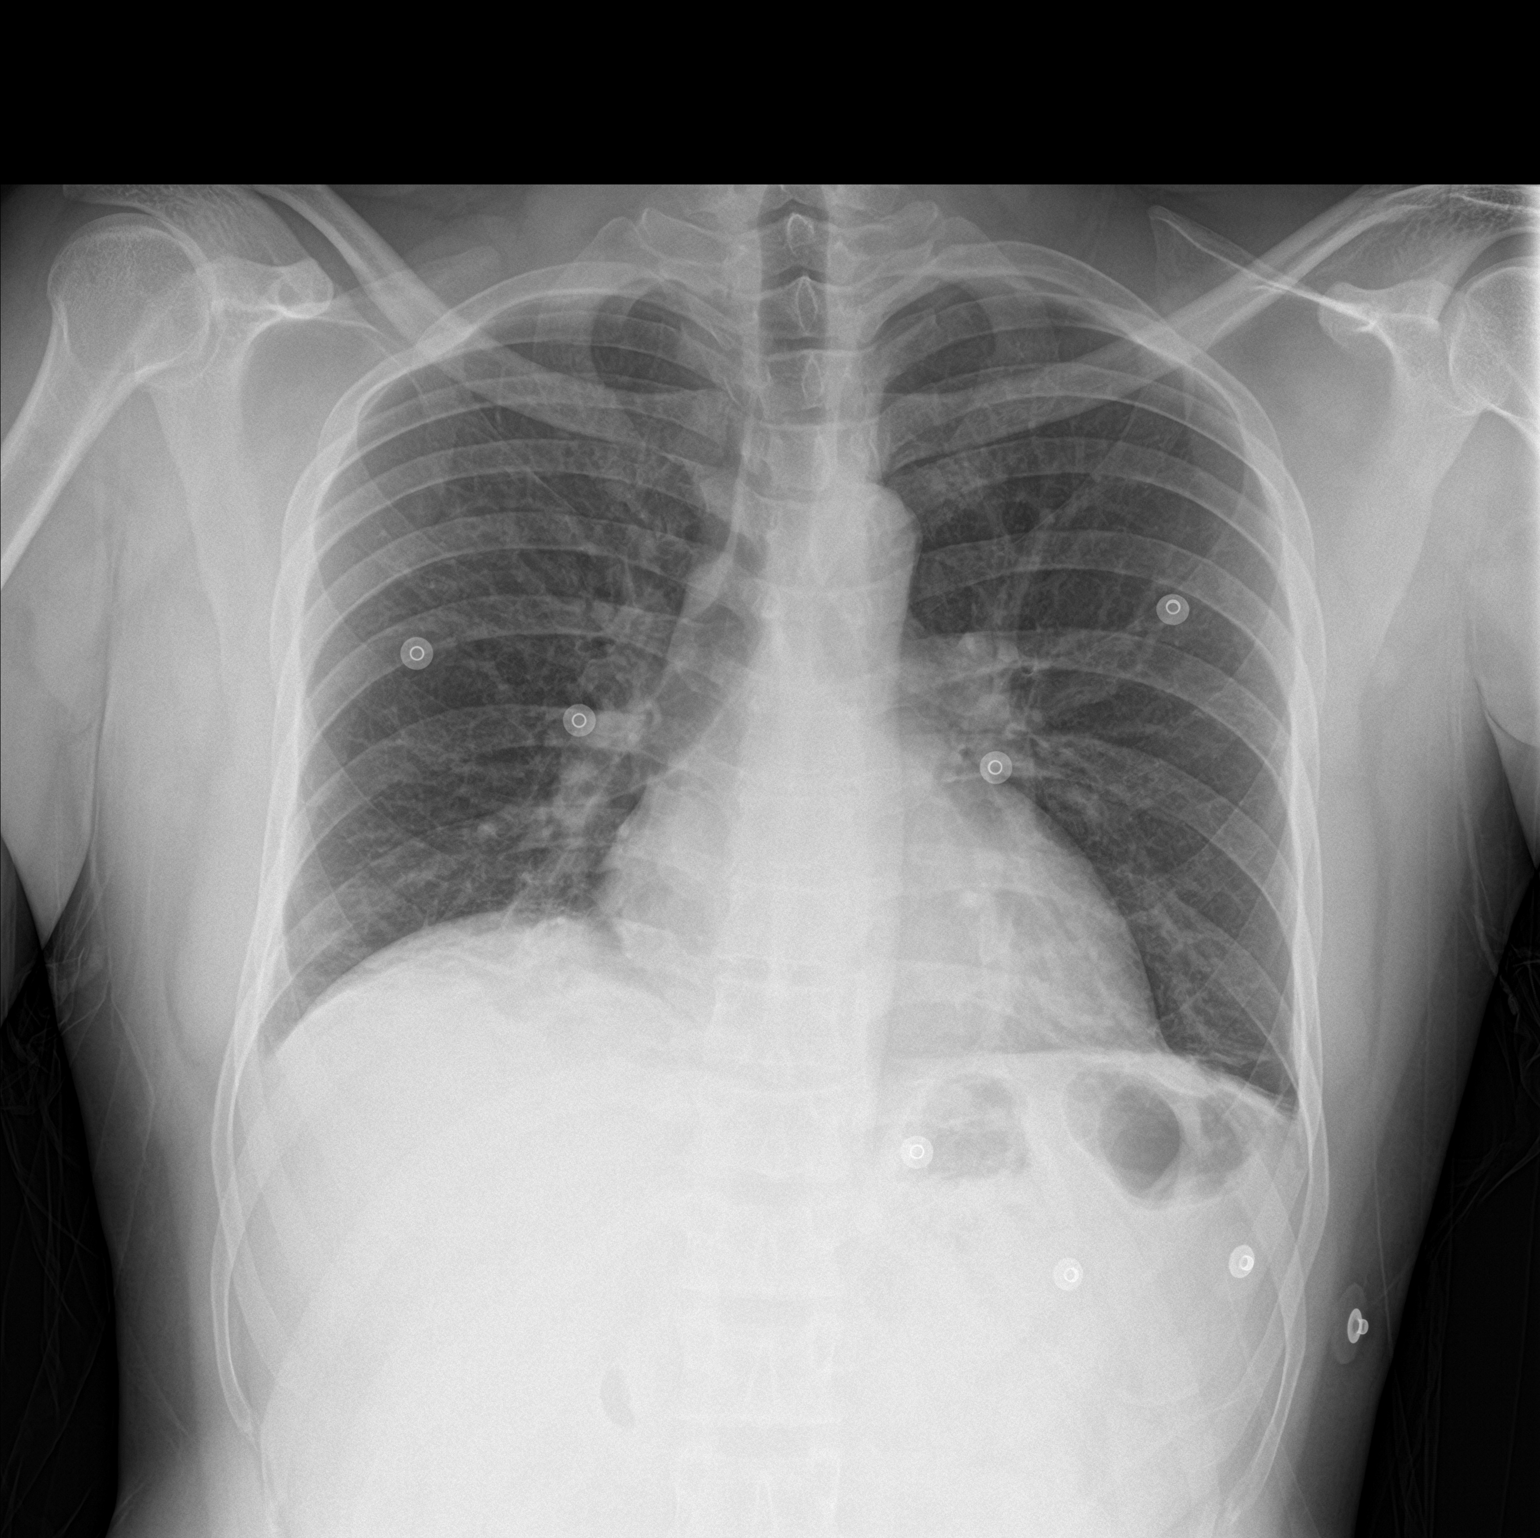

[chest lat]
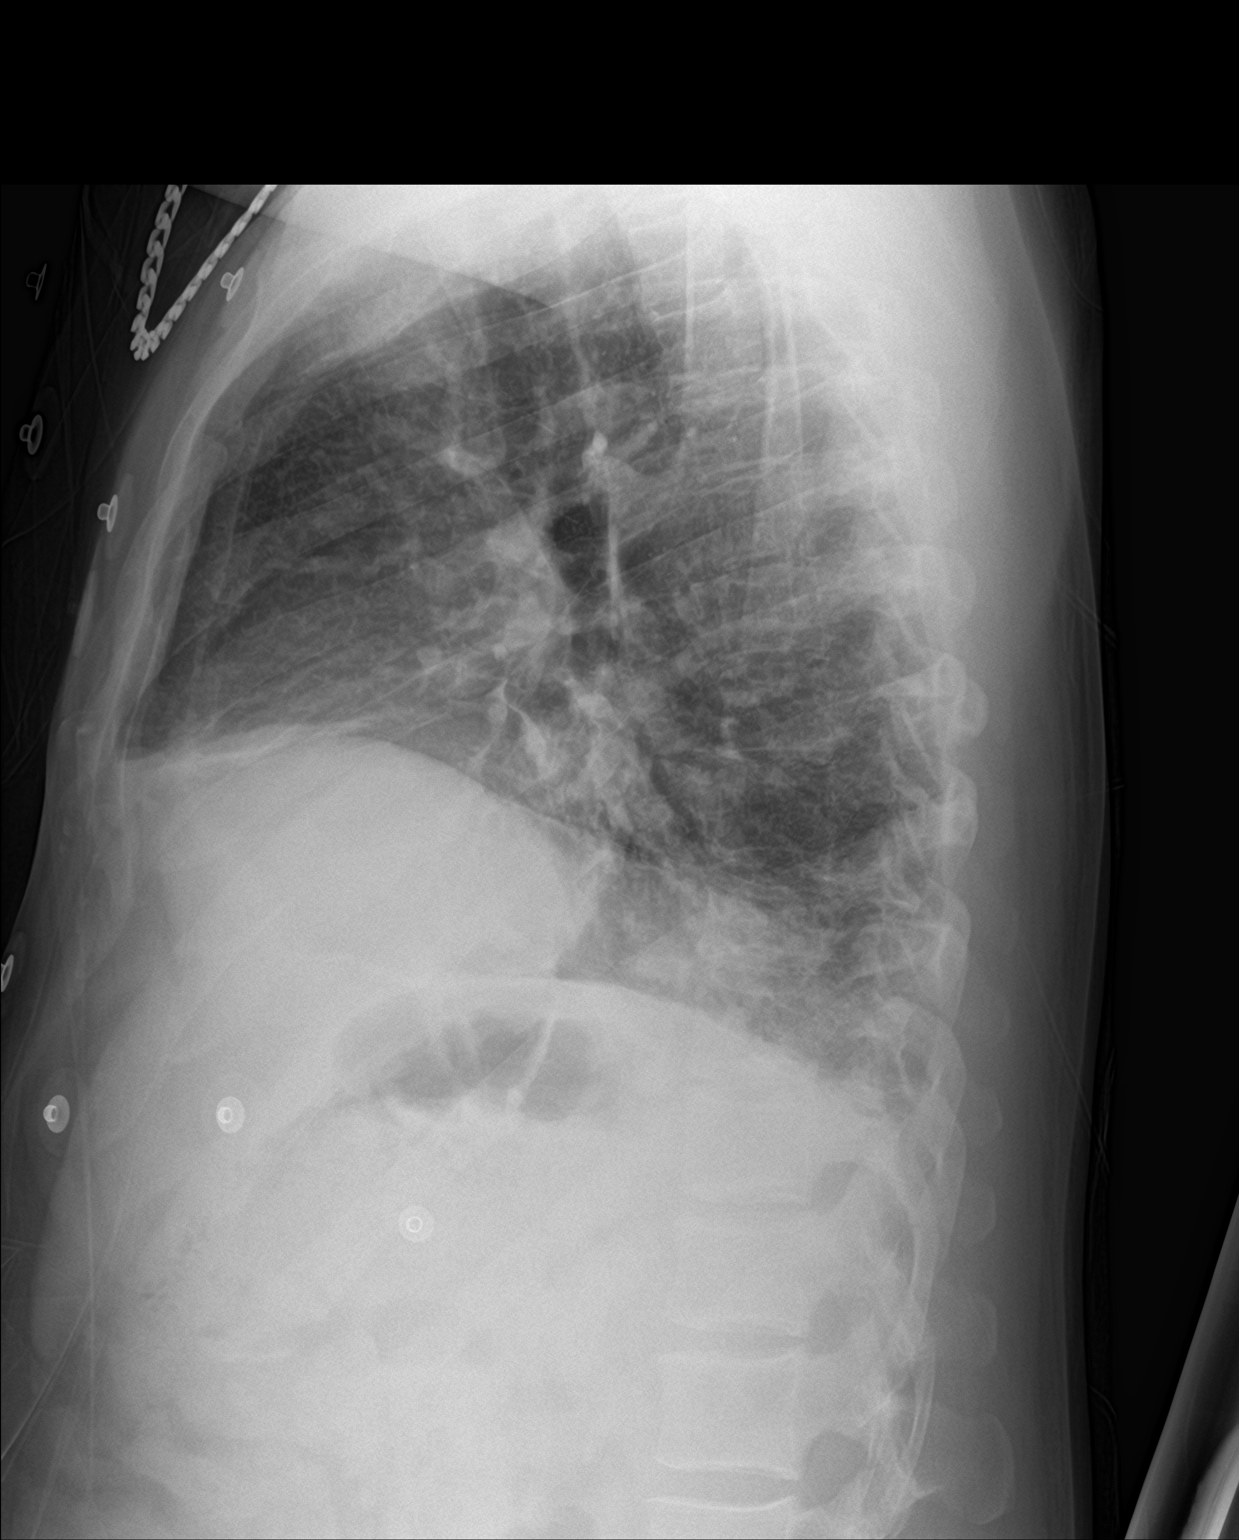

[2 of 2 positions shown; findings below may reference images not displayed]

FINDINGS: The heart size and mediastinal contours are within normal limits.
There is atelectasis at each lung base. No pneumonic consolidation,
pulmonary edema, effusion or pneumothorax is noted. The visualized
skeletal structures are unremarkable.
IMPRESSION: No active cardiopulmonary disease.  Bibasilar atelectasis.

## 2019-04-23 ENCOUNTER — Ambulatory Visit (HOSPITAL_COMMUNITY)
Admission: EM | Admit: 2019-04-23 | Discharge: 2019-04-23 | Disposition: A | Discharge status: Home / Self Care | Payer: Self-pay | Attending: Family Medicine | Admitting: Family Medicine

## 2019-04-23 ENCOUNTER — Encounter (HOSPITAL_COMMUNITY): Payer: Self-pay | Admitting: *Deleted

## 2019-04-23 ENCOUNTER — Ambulatory Visit (INDEPENDENT_AMBULATORY_CARE_PROVIDER_SITE_OTHER): Payer: Self-pay

## 2019-04-23 ENCOUNTER — Other Ambulatory Visit: Payer: Self-pay

## 2019-04-23 DIAGNOSIS — M545 Low back pain, unspecified: Secondary | ICD-10-CM

## 2019-04-23 MED ORDER — BACLOFEN 10 MG PO TABS: 10.0000 mg | ORAL_TABLET | Freq: Three times a day (TID) | ORAL | 0 refills | Status: DC

## 2019-04-23 MED ORDER — PREDNISONE 10 MG PO TABS: 20.0000 mg | ORAL_TABLET | Freq: Every day | ORAL | 0 refills | Status: DC

## 2019-04-23 NOTE — Discharge Instructions (Addendum)
Youer X-ray show no evidence of lumbar spine fracture. Alignment is normal.Intervertebral disc spaces are maintained.   HOME CARE INSTRUCTIONS: For many people, back pain returns. Since low back pain is rarely dangerous, it is often a condition that people can learn to manage on their own. Please remain active. It is stressful on the back to sit or stand in one place. Do not sit, drive, or stand in one place for more than 30 minutes at a time. Take short walks on level surfaces as soon as pain allows. Try to increase the length of time you walk each day. Do not stay in bed. Resting more than 1 or 2 days can delay your recovery. Do not avoid exercise or work. Your body is made to move. It is not dangerous to be active, even though your back may hurt. Your back will likely heal faster if you return to being active before your pain is gone. Over-the-counter medicines to reduce pain and inflammation are often the most helpful.  SEEK MEDICAL CARE IF: You have pain that is not relieved with rest or medicine. You have pain that does not improve in 1 week. You have new symptoms. You are generally not feeling well. SEEK IMMEDIATE MEDICAL CARE IF: You have pain that radiates from your back into your legs. You develop new bowel or bladder control problems. You have unusual weakness or numbness in your arms or legs. You develop nausea or vomiting. You develop abdominal pain. You feel faint.

## 2019-04-23 NOTE — ED Provider Notes (Signed)
[facility]    CSN: [id number] Arrival date & time: 04/23/19  1054      History   Chief Complaint Chief Complaint  Patient presents with  . Back Pain    HPI Aaron Stephens is a 33 y.o. male.   The history is provided by the patient. No language interpreter was used.  Back Pain Location:  Lumbar spine Quality:  Aching Radiates to:  Does not radiate Pain severity:  Severe Pain is:  Same all the time Onset quality:  Sudden Duration:  4 weeks Timing:  Constant Progression:  Worsening Chronicity:  Chronic Context: not falling, not lifting heavy objects, not MVA, not recent illness, not recent injury and not twisting   Relieved by:  Nothing Worsened by:  Nothing Ineffective treatments:  Bed rest Associated symptoms: no bladder incontinence, no bowel incontinence, no chest pain, no fever, no headaches, no leg pain, no numbness, no pelvic pain, no tingling, no weakness and no weight loss     Past Medical History:  Diagnosis Date  . ADHD (attention deficit hyperactivity disorder)     Patient Active Problem List   Diagnosis Date Noted  . Hyperglycemia, unspecified 02/08/2018  . Medication monitoring encounter 10/18/2017  . Screening examination for venereal disease 01/14/2017  . Encounter for long-term (current) use of high-risk medication 01/14/2017  . Tobacco abuse 01/14/2017  . Depression 04/16/2016  . Human immunodeficiency virus I infection (HCC) 03/12/2016  . Attention deficit hyperactivity disorder (ADHD) 03/12/2016    History reviewed. No pertinent surgical history.     Home Medications    Prior to Admission medications   Medication Sig Start Date End Date Taking? Authorizing Provider  GENVOYA 150-150-200-10 MG TABS tablet TAKE 1 TABLET BY MOUTH DAILY 12/14/18  Yes Comer, Belia Hemanobert W, MD  baclofen (LIORESAL) 10 MG tablet Take 1 tablet (10 mg total) by mouth 3 (three) times daily. 04/23/19   Devynn Hessler, Zachery DakinsKomlanvi S, FNP   elvitegravir-cobicistat-emtricitabine-tenofovir (GENVOYA) 150-150-200-10 MG TABS tablet Take 1 tablet by mouth daily with breakfast. 10/29/17   Comer, Belia Hemanobert W, MD  predniSONE (DELTASONE) 10 MG tablet Take 2 tablets (20 mg total) by mouth daily. 04/23/19   Durward ParcelAvegno, Nazyia Gaugh S, FNP    Family History Family History  Problem Relation Age of Onset  . Hypertension Mother   . Sickle cell anemia Mother   . Heart failure Mother   . Hypertension Father     Social History Social History   Tobacco Use  . Smoking status: Current Every Day Smoker    Packs/day: 1.00    Years: 9.00    Pack years: 9.00    Types: Cigarettes    Start date: 06/02/2007  . Smokeless tobacco: Never Used  . Tobacco comment: pt isn't ready to quit; counseled to quit  Substance Use Topics  . Alcohol use: Not Currently  . Drug use: No     Allergies   Patient has no known allergies.   Review of Systems Review of Systems  Constitutional: Negative for activity change, appetite change, chills, fatigue, fever and weight loss.  Respiratory: Negative for cough, chest tightness and shortness of breath.   Cardiovascular: Negative for chest pain.  Gastrointestinal: Negative for bowel incontinence.  Genitourinary: Negative for bladder incontinence and pelvic pain.  Musculoskeletal: Positive for back pain. Negative for gait problem, neck pain and neck stiffness.  Neurological: Negative for dizziness, tingling, weakness, numbness and headaches.  ROS: All other are negatives   Physical Exam Triage Vital Signs ED Triage Vitals  Enc Vitals Group     BP 04/23/19 1123 (!) 141/87     Pulse Rate 04/23/19 1123 90     Resp 04/23/19 1123 16     Temp 04/23/19 1123 98.5 F (36.9 C)     Temp Source 04/23/19 1123 Oral     SpO2 04/23/19 1123 99 %     Weight --      Height --      Head Circumference --      Peak Flow --      Pain Score 04/23/19 1122 10     Pain Loc --      Pain Edu? --      Excl. in GC? --    No data found.   Updated Vital Signs BP (!) 141/87   Pulse 90   Temp 98.5 F (36.9 C) (Oral)   Resp 16   SpO2 99%   Visual Acuity Right Eye Distance:   Left Eye Distance:   Bilateral Distance:    Right Eye Near:   Left Eye Near:    Bilateral Near:     Physical Exam Constitutional:      General: He is not in acute distress.    Appearance: Normal appearance. He is normal weight. He is not ill-appearing or toxic-appearing.  Cardiovascular:     Rate and Rhythm: Normal rate and regular rhythm.     Pulses: Normal pulses.     Heart sounds: Normal heart sounds. No murmur.  Pulmonary:     Effort: Pulmonary effort is normal. No respiratory distress.     Breath sounds: Normal breath sounds. No wheezing.  Chest:     Chest wall: No tenderness.  Musculoskeletal:        General: No swelling, tenderness, deformity or signs of injury.     Lumbar back: He exhibits pain. He exhibits no swelling and no edema.  Neurological:     Mental Status: He is alert and oriented to person, place, and time.     Cranial Nerves: Cranial nerves are intact.     Deep Tendon Reflexes:     Reflex Scores:      Patellar reflexes are 2+ on the right side and 2+ on the left side.     UC Treatments / Results  Labs (all labs ordered are listed, but only abnormal results are displayed) Labs Reviewed - No data to display  EKG   Radiology Dg Lumbar Spine Complete  Result Date: 04/23/2019 CLINICAL DATA:  Lower back pain for 1 month, worsening EXAM: LUMBAR SPINE - COMPLETE 4+ VIEW COMPARISON:  None. FINDINGS: There is no evidence of lumbar spine fracture. Alignment is normal. Intervertebral disc spaces are maintained. IMPRESSION: Negative. Electronically Signed   By: Marnee Spring M.D.   On: 04/23/2019 12:22    Procedures Procedures (including critical care time)  Medications Ordered in UC Medications - No data to display  Initial Impression / Assessment and Plan / UC Course  I have reviewed the triage vital signs  and the nursing notes.  Pertinent labs & imaging results that were available during my care of the patient were reviewed by me and considered in my medical decision making (see chart for details).  Xray show  no evidence of lumbar spine fracture. Short term dose of prednisone and baclofen was ordered for this patient. He was advised to follow up with PCP and to return if symptom get worse  Final Clinical Impressions(s) / UC Diagnoses   Final diagnoses:  Acute  bilateral low back pain without sciatica     Discharge Instructions     Youer X-ray show no evidence of lumbar spine fracture. Alignment is normal.Intervertebral disc spaces are maintained.   HOME CARE INSTRUCTIONS: For many people, back pain returns. Since low back pain is rarely dangerous, it is often a condition that people can learn to manage on their own. Please remain active. It is stressful on the back to sit or stand in one place. Do not sit, drive, or stand in one place for more than 30 minutes at a time. Take short walks on level surfaces as soon as pain allows. Try to increase the length of time you walk each day. Do not stay in bed. Resting more than 1 or 2 days can delay your recovery. Do not avoid exercise or work. Your body is made to move. It is not dangerous to be active, even though your back may hurt. Your back will likely heal faster if you return to being active before your pain is gone. Over-the-counter medicines to reduce pain and inflammation are often the most helpful.  SEEK MEDICAL CARE IF: You have pain that is not relieved with rest or medicine. You have pain that does not improve in 1 week. You have new symptoms. You are generally not feeling well. SEEK IMMEDIATE MEDICAL CARE IF: You have pain that radiates from your back into your legs. You develop new bowel or bladder control problems. You have unusual weakness or numbness in your arms or legs. You develop nausea or vomiting. You develop abdominal pain. You  feel faint.      ED Prescriptions    Medication Sig Dispense Auth. Provider   baclofen (LIORESAL) 10 MG tablet Take 1 tablet (10 mg total) by mouth 3 (three) times daily. 30 each Torrez Renfroe, Darrelyn Hillock, FNP   predniSONE (DELTASONE) 10 MG tablet Take 2 tablets (20 mg total) by mouth daily. 15 tablet Erica Richwine, Darrelyn Hillock, FNP     PDMP not reviewed this encounter.   Emerson Monte, [place] 04/23/19 1246

## 2019-04-23 NOTE — ED Triage Notes (Signed)
C/O non-radiating low back pain x few weeks with gradual worsening.  Denies parasthesias.

## 2019-05-03 ENCOUNTER — Encounter: Payer: Self-pay | Admitting: Internal Medicine

## 2019-05-03 ENCOUNTER — Ambulatory Visit (INDEPENDENT_AMBULATORY_CARE_PROVIDER_SITE_OTHER): Payer: Self-pay | Admitting: Internal Medicine

## 2019-05-03 ENCOUNTER — Other Ambulatory Visit: Payer: Self-pay

## 2019-05-03 VITALS — BP 115/70 | HR 104 | Temp 99.2°F | Ht 71.0 in | Wt 208.3 lb

## 2019-05-03 DIAGNOSIS — M545 Low back pain, unspecified: Secondary | ICD-10-CM

## 2019-05-03 DIAGNOSIS — M549 Dorsalgia, unspecified: Secondary | ICD-10-CM | POA: Insufficient documentation

## 2019-05-03 DIAGNOSIS — M461 Sacroiliitis, not elsewhere classified: Secondary | ICD-10-CM

## 2019-05-03 MED ORDER — DICLOFENAC SODIUM 25 MG PO TBEC: 25.0000 mg | DELAYED_RELEASE_TABLET | Freq: Four times a day (QID) | ORAL | 1 refills | Status: DC

## 2019-05-03 NOTE — Progress Notes (Signed)
   CC: back pain   HPI:  [name] is a 33 y.o. m with a history as noted below who presents with back pain since October. Pt states that the pain was intermittent, then started to worsen after Thanksgiving.  Patient states there was no inciting injury and that the pain has been "doing its own thing." 5/10 in severity on average, but when it flares up it can reach 10/10.  He visited urgent care on 11/22 due to worsening pain and was prescribed prednisone 20 mg 2 tablets a day and baclofen 10 mg twice daily.  The patient states that he took the prednisone intermittently and did not take the baclofen.  He felt like the prednisone was helpful.  Bending forward worsens the pain.  When the patient does experience pain, it does not radiate past his buttocks. Laying down makes it better feel.  Patient has not tried any home exercises or PT.    Past Medical History:  Diagnosis Date  . ADHD (attention deficit hyperactivity disorder)    Review of Systems: Denies fever, headache, vision changes, chest pain, shortness of breath, nausea/vomiting, saddle anesthesia, numbness or tingling that coincides with the pain, or changes in his bowel or bladder habits.  Physical Exam:  Vitals:   05/03/19 1524 05/03/19 1534  BP: 115/70 115/70  Pulse: (!) 104 (!) 104  Temp: 99.2 F (37.3 C) 99.2 F (37.3 C)  TempSrc: Oral Oral  SpO2: 99% 99%  Weight: 208 lb 4.8 oz (94.5 kg) 208 lb 4.8 oz (94.5 kg)  Height: 5\' 11"  (1.803 m) 5\' 11"  (1.803 m)   Physical Exam Constitutional:      General: He is not in acute distress.    Appearance: Normal appearance. He is not ill-appearing or toxic-appearing.  HENT:     Head: Normocephalic and atraumatic.  Eyes:     General: No scleral icterus.       Right eye: No discharge.        Left eye: No discharge.     Extraocular Movements: Extraocular movements intact.  Cardiovascular:     Comments: Warm and well-perfused Musculoskeletal:     Lumbar back: He exhibits  decreased range of motion, tenderness and pain. He exhibits no edema, no deformity and no laceration.       Back:       Arms:     Comments: -No pain with facet loading. -Patient had significantly decreased range of motion with flexion and extension of the spine -Hamstrings very tight on exam, when asked to touch his toes, the patient started bending his knees with his hands reached past the knee level  Neurological:     Mental Status: He is alert.  Psychiatric:     Comments: Frustrated affect    Assessment & Plan:   See Encounters Tab for problem based charting.  Patient seen with Dr. Heber Aquebogue

## 2019-05-03 NOTE — Assessment & Plan Note (Addendum)
Patient's back pain is pointed, and without sciatica.  Patient had tenderness to palpation at the SI joint on the left side.  His presentation appears consistent with SI joint pain.  Patient did take intermittent short course of prednisone which was helpful.  The patient was under the impression that his pain would be taken away after 1 visit.  I set expectations, noting that back pain is something that we have to work on over a long-term and will likely come back again.  Patient was not happy with this and became frustrated during the visit.  He stated "coming here was a waste of time".  After reassuring the patient that we would be happy to establish care and continue to see him, the patient stated that he will have to check his work schedule to see what he can come back. -Diclofenac 25 mg 4 times daily for 2 weeks. Patient instructed to not take this medication any longer than 2 weeks to avoid kidney injury. -Recommended physical therapy, however the patient is not interested in this at this time -Recommended follow-up in 4 weeks.  Patient said he would check his schedule.

## 2019-05-03 NOTE — Patient Instructions (Addendum)
Thank you for visiting [place] in clinic today.  Below is a summary of what we discussed:  1.  Back pain -Your back pain is most likely coming from the sacroiliac joint. -Take diclofenac 25 mg 4 times a day with food.   2. Follow up  -Schedule follow-up appointment at your convenience within the next 4 weeks  You have any questions or concerns, please feel free to reach out to [place].

## 2019-05-04 ENCOUNTER — Telehealth: Payer: Self-pay | Admitting: General Practice

## 2019-05-04 NOTE — Telephone Encounter (Signed)
rtc to pt, appt made for f/u

## 2019-05-04 NOTE — Telephone Encounter (Signed)
Pt missed a call, pls contact [phone]

## 2019-05-04 NOTE — Telephone Encounter (Signed)
Rtc to pt to related to the My chart message request for a follow up appt.  The pt was angry and verbally abusive and said,"You all don't know S---".  I did not get what  I came for yesterday, we don't know what we are doing. In closing patient said " F---- you." Pt is for sch 05/16/2019 @ 2:45pm with the [facility] .

## 2019-05-04 NOTE — Telephone Encounter (Signed)
Please let me know if his behavior continues to be inappropriate and I with this medicine.  Sorry you had to deal with the experience.

## 2019-05-15 NOTE — Progress Notes (Signed)
Internal Medicine Clinic Attending ° °I saw and evaluated the patient.  I personally confirmed the key portions of the history and exam documented by Dr. Alexander and I reviewed pertinent patient test results.  The assessment, diagnosis, and plan were formulated together and I agree with the documentation in the resident’s note.  °

## 2019-05-16 ENCOUNTER — Encounter: Payer: Self-pay | Admitting: Internal Medicine

## 2019-05-16 ENCOUNTER — Other Ambulatory Visit: Payer: Self-pay

## 2019-05-16 ENCOUNTER — Ambulatory Visit (INDEPENDENT_AMBULATORY_CARE_PROVIDER_SITE_OTHER): Payer: Self-pay | Admitting: Internal Medicine

## 2019-05-16 DIAGNOSIS — F909 Attention-deficit hyperactivity disorder, unspecified type: Secondary | ICD-10-CM

## 2019-05-16 DIAGNOSIS — Z0000 Encounter for general adult medical examination without abnormal findings: Secondary | ICD-10-CM

## 2019-05-16 DIAGNOSIS — B20 Human immunodeficiency virus [HIV] disease: Secondary | ICD-10-CM

## 2019-05-16 DIAGNOSIS — Z720 Tobacco use: Secondary | ICD-10-CM

## 2019-05-16 DIAGNOSIS — Z79899 Other long term (current) drug therapy: Secondary | ICD-10-CM

## 2019-05-16 DIAGNOSIS — E785 Hyperlipidemia, unspecified: Secondary | ICD-10-CM

## 2019-05-16 DIAGNOSIS — Z21 Asymptomatic human immunodeficiency virus [HIV] infection status: Secondary | ICD-10-CM

## 2019-05-16 NOTE — Assessment & Plan Note (Signed)
Up-to-date.  Requires tetanus vaccine

## 2019-05-16 NOTE — Progress Notes (Addendum)
   CC: Hyperlipidemia   HPI:  [name] is a 33 y.o. with HIV, tobacco use disorder, ADHD here for routine physical as well as to follow up on hyperlipidemia.  Please see problem based charting for further details.  Past Medical History:  Diagnosis Date  . ADHD (attention deficit hyperactivity disorder)    Review of Systems:   Review of Systems  Constitutional: Negative for chills, fever, malaise/fatigue and weight loss.  HENT: Negative.   Eyes: Negative.   Respiratory: Negative.   Cardiovascular: Negative.   Gastrointestinal: Negative.   Genitourinary: Negative.   Musculoskeletal: Negative.   Skin: Negative for rash.  Neurological: Negative.   Psychiatric/Behavioral: Negative for depression.    Physical Exam:  Vitals:   05/16/19 1446  BP: 132/87  Pulse: 96  Temp: 98.2 F (36.8 C)  TempSrc: Oral  SpO2: 99%  Weight: 206 lb (93.4 kg)  Height: 5\' 11"  (1.803 m)   Physical Exam  Constitutional: He is well-developed, well-nourished, and in no distress. No distress.  HENT:  Head: Normocephalic and atraumatic.  Cardiovascular: Normal rate, regular rhythm and normal heart sounds. Exam reveals no friction rub.  No murmur heard. Pulmonary/Chest: Effort normal and breath sounds normal. He has no wheezes. He has no rales.  Abdominal: Soft. Bowel sounds are normal. He exhibits no distension. There is no abdominal tenderness. There is no rebound.  Musculoskeletal:        General: No tenderness, deformity or edema.  Skin: He is not diaphoretic.  Psychiatric: Mood and affect normal.    Assessment & Plan:   See Encounters Tab for problem based charting.  Patient discussed with Dr. Philipp Ovens

## 2019-05-16 NOTE — Assessment & Plan Note (Addendum)
Well-controlled on Genvoya.  I have advised him to continue routine follow-up with Dr. Linus Salmons.

## 2019-05-16 NOTE — Patient Instructions (Signed)
Mr. Aaron Stephens,  It was a pleasure taking care of you here in the clinic today.  Overall, he seems to be doing well.  The lab that was done in September showed regarding cholesterol level and I will monitor this advised you to exercise and cut down on fast foods.  We will repeat the labs in about 6 months and if your cholesterol level is still high then we will start you on medication.  Take care! Dr. Eileen Stanford  Please call the internal medicine center clinic if you have any questions or concerns, we may be able to help and keep you from a long and expensive emergency room wait. Our clinic and after hours phone number is [phone], the best time to call is Monday through Friday 9 am to 4 pm but there is always someone available 24/7 if you have an emergency. If you need medication refills please notify your pharmacy one week in advance and they will send [place] a request.

## 2019-05-16 NOTE — Assessment & Plan Note (Signed)
Recent laboratory results in September consistent with hyperlipidemia.  He is at an intermediate risk with ASCVD score of 5.8.  Plan: -Advised on lifestyle modification -Repeat lipid panel in 6 months.  If consistently elevated, can initiate low-dose statin

## 2019-05-18 NOTE — Progress Notes (Signed)
Internal Medicine Clinic Attending  Case discussed with Dr. Agyei at the time of the visit.  We reviewed the resident's history and exam and pertinent patient test results.  I agree with the assessment, diagnosis, and plan of care documented in the resident's note.    

## 2019-06-13 ENCOUNTER — Other Ambulatory Visit: Payer: Self-pay

## 2019-06-13 ENCOUNTER — Ambulatory Visit: Payer: Medicaid Other

## 2019-06-19 ENCOUNTER — Other Ambulatory Visit: Payer: Self-pay | Admitting: Internal Medicine

## 2019-06-19 DIAGNOSIS — B20 Human immunodeficiency virus [HIV] disease: Secondary | ICD-10-CM

## 2019-07-26 ENCOUNTER — Encounter: Payer: Self-pay | Admitting: Internal Medicine

## 2019-08-21 ENCOUNTER — Encounter: Payer: Medicaid Other | Admitting: Internal Medicine

## 2019-08-21 ENCOUNTER — Encounter: Payer: Self-pay | Admitting: Internal Medicine

## 2019-09-11 ENCOUNTER — Other Ambulatory Visit: Payer: Self-pay

## 2019-09-11 ENCOUNTER — Other Ambulatory Visit: Payer: Medicaid Other

## 2019-09-11 DIAGNOSIS — B20 Human immunodeficiency virus [HIV] disease: Secondary | ICD-10-CM

## 2019-09-12 LAB — T-HELPER CELL (CD4) - (RCID CLINIC ONLY)
CD4 % Helper T Cell: 54 % (ref 33–65)
CD4 T Cell Abs: 758 /uL (ref 400–1790)

## 2019-09-13 LAB — HIV-1 RNA QUANT-NO REFLEX-BLD
HIV 1 RNA Quant: <20 copies/mL — AB
HIV-1 RNA Quant, Log: <1.3 Log copies/mL — AB

## 2019-09-25 ENCOUNTER — Other Ambulatory Visit: Payer: Self-pay

## 2019-09-25 ENCOUNTER — Ambulatory Visit (INDEPENDENT_AMBULATORY_CARE_PROVIDER_SITE_OTHER): Payer: Self-pay | Admitting: Internal Medicine

## 2019-09-25 ENCOUNTER — Encounter: Payer: Self-pay | Admitting: Internal Medicine

## 2019-09-25 VITALS — BP 128/82 | HR 87 | Ht 71.0 in | Wt 210.0 lb

## 2019-09-25 DIAGNOSIS — B20 Human immunodeficiency virus [HIV] disease: Secondary | ICD-10-CM

## 2019-09-25 DIAGNOSIS — Z113 Encounter for screening for infections with a predominantly sexual mode of transmission: Secondary | ICD-10-CM

## 2019-09-25 DIAGNOSIS — Z79899 Other long term (current) drug therapy: Secondary | ICD-10-CM

## 2019-09-25 DIAGNOSIS — Z23 Encounter for immunization: Secondary | ICD-10-CM | POA: Insufficient documentation

## 2019-09-25 NOTE — Progress Notes (Signed)
   Subjective:    Patient ID: Aaron Stephens, male    DOB: 04/09/1986, 34 y.o.   MRN: [id number]  HPI Here for follow up of HIV CD4 of 758, viral load < 20.  No missed doses. No associated n/v/d.  No rashes.  Moving to [facility] but will continue to get his care here.    Review of Systems  Constitutional: Negative for activity change.  Gastrointestinal: Negative for nausea.  Skin: Negative for rash.       Objective:   Physical Exam Constitutional:      Appearance: Normal appearance. He is well-developed.  Eyes:     General: No scleral icterus. Cardiovascular:     Rate and Rhythm: Normal rate and regular rhythm.     Heart sounds: Normal heart sounds. No murmur.  Pulmonary:     Effort: Pulmonary effort is normal.  Skin:    Findings: No rash.  Neurological:     General: No focal deficit present.  Psychiatric:        Mood and Affect: Mood normal.    SH: + tobacco       Assessment & Plan:

## 2019-09-25 NOTE — Assessment & Plan Note (Signed)
Will start hepatitis A series today

## 2019-09-25 NOTE — Assessment & Plan Note (Signed)
Labs are good, reassuring and no missed doses.  He remains on track rtc 6 months

## 2019-10-12 ENCOUNTER — Other Ambulatory Visit: Payer: Self-pay | Admitting: Internal Medicine

## 2019-10-12 DIAGNOSIS — B20 Human immunodeficiency virus [HIV] disease: Secondary | ICD-10-CM

## 2019-12-13 ENCOUNTER — Encounter: Payer: Self-pay | Admitting: *Deleted

## 2019-12-21 ENCOUNTER — Encounter: Payer: Self-pay | Admitting: Internal Medicine

## 2019-12-21 ENCOUNTER — Ambulatory Visit: Payer: Medicaid Other

## 2019-12-25 ENCOUNTER — Ambulatory Visit: Payer: Medicaid Other

## 2019-12-27 ENCOUNTER — Ambulatory Visit: Payer: Self-pay

## 2019-12-27 ENCOUNTER — Other Ambulatory Visit: Payer: Self-pay

## 2020-01-02 ENCOUNTER — Encounter: Payer: Self-pay | Admitting: Internal Medicine

## 2020-01-15 ENCOUNTER — Encounter: Payer: Medicaid Other | Admitting: Internal Medicine

## 2020-01-17 ENCOUNTER — Encounter: Payer: Medicaid Other | Admitting: Internal Medicine

## 2020-02-08 ENCOUNTER — Other Ambulatory Visit: Payer: Self-pay | Admitting: Internal Medicine

## 2020-02-08 DIAGNOSIS — B20 Human immunodeficiency virus [HIV] disease: Secondary | ICD-10-CM

## 2020-02-27 ENCOUNTER — Other Ambulatory Visit: Payer: Self-pay | Admitting: Internal Medicine

## 2020-02-27 DIAGNOSIS — B20 Human immunodeficiency virus [HIV] disease: Secondary | ICD-10-CM

## 2020-02-28 ENCOUNTER — Other Ambulatory Visit: Payer: Self-pay | Admitting: Internal Medicine

## 2020-02-28 DIAGNOSIS — B20 Human immunodeficiency virus [HIV] disease: Secondary | ICD-10-CM

## 2020-03-01 ENCOUNTER — Other Ambulatory Visit: Payer: Self-pay | Admitting: Internal Medicine

## 2020-03-01 ENCOUNTER — Other Ambulatory Visit: Payer: Self-pay

## 2020-03-01 DIAGNOSIS — B20 Human immunodeficiency virus [HIV] disease: Secondary | ICD-10-CM

## 2020-03-01 MED ORDER — GENVOYA 150-150-200-10 MG PO TABS: 1.0000 | ORAL_TABLET | Freq: Every day | ORAL | 0 refills | Status: DC

## 2020-03-03 IMAGING — DX DG CHEST 2V
2 series · 2 of 2 positions shown · non-contrast
Comparison: 08/09/2017

CLINICAL DATA: Chest pain.

EXAM:
CHEST - 2 VIEW

[w chest pa]
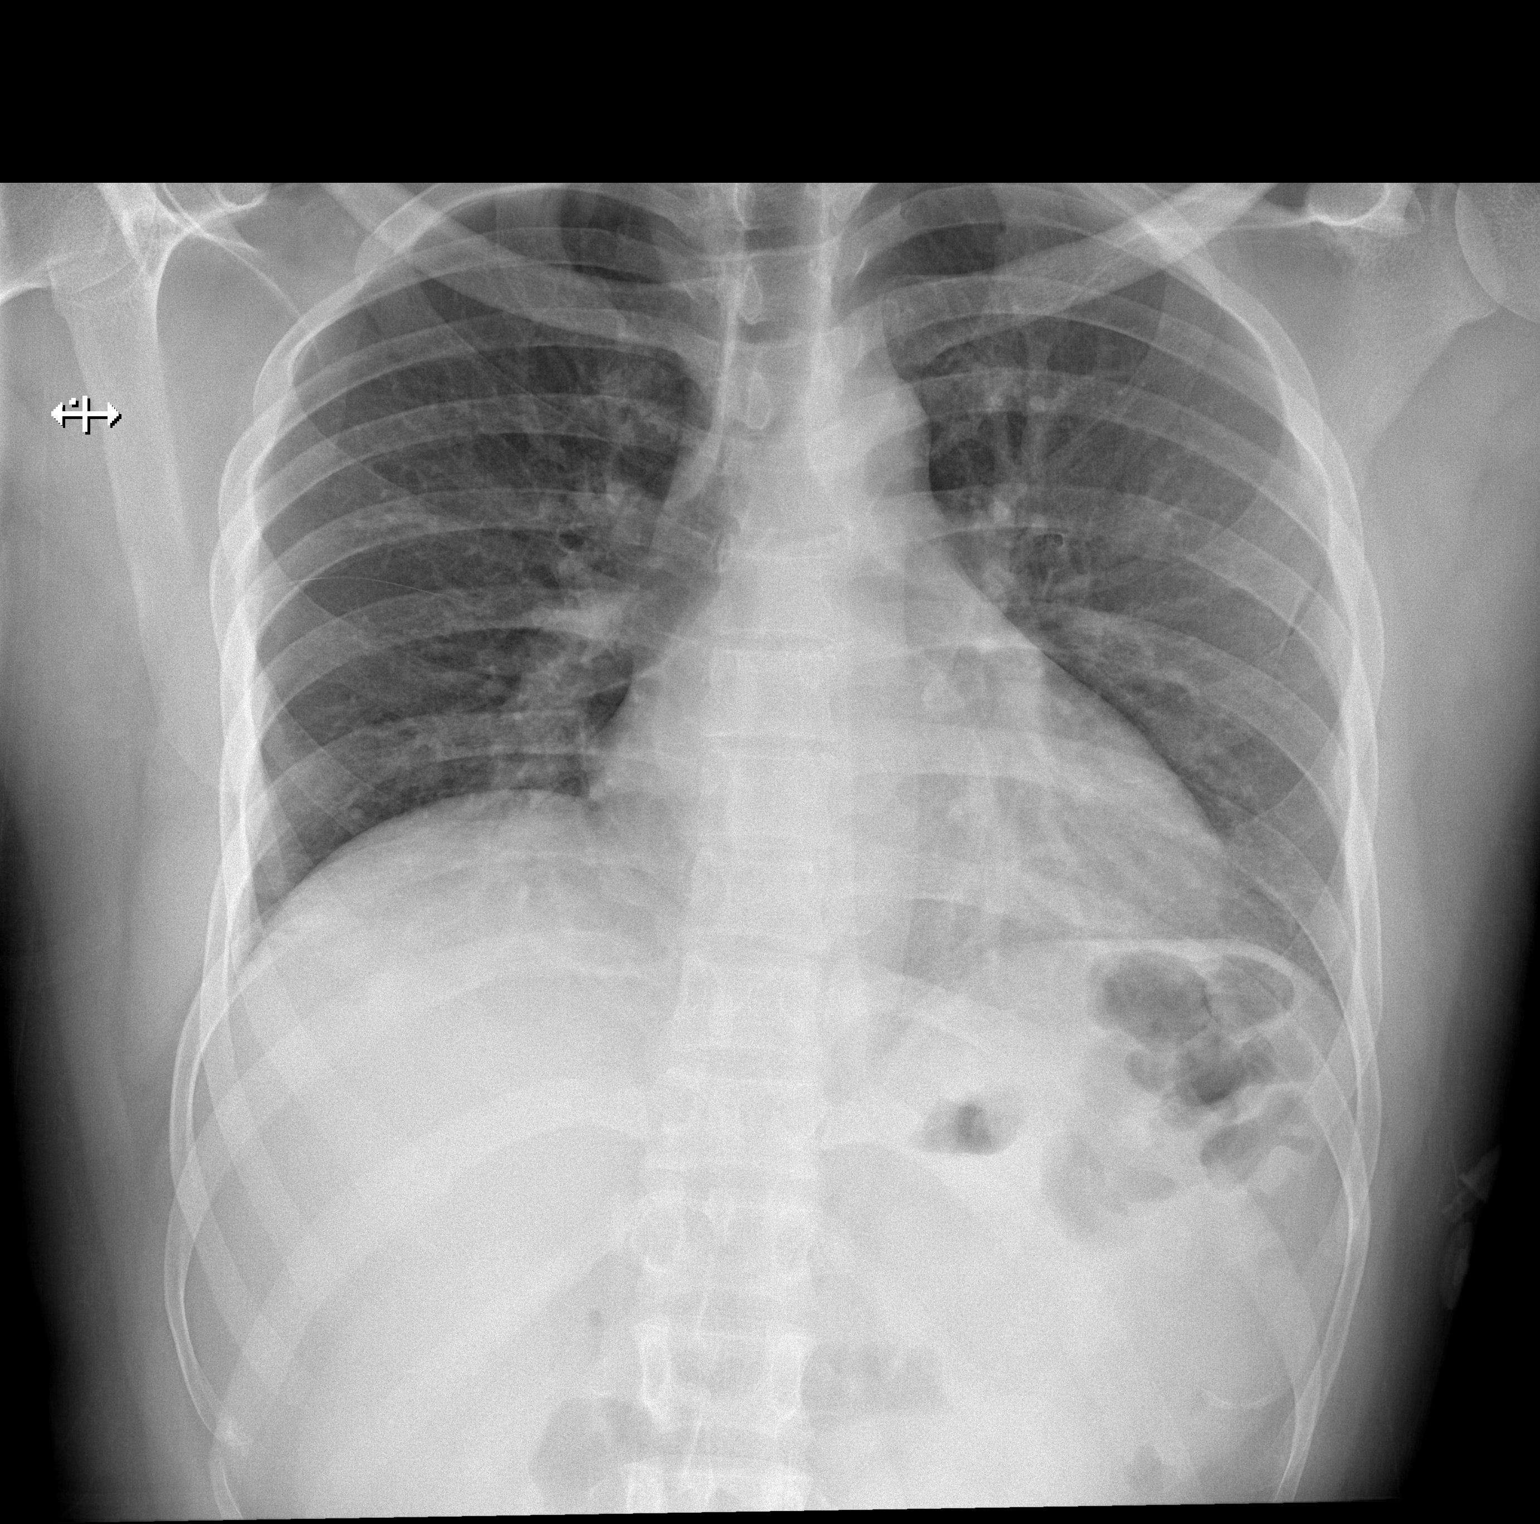

[w chest lat]
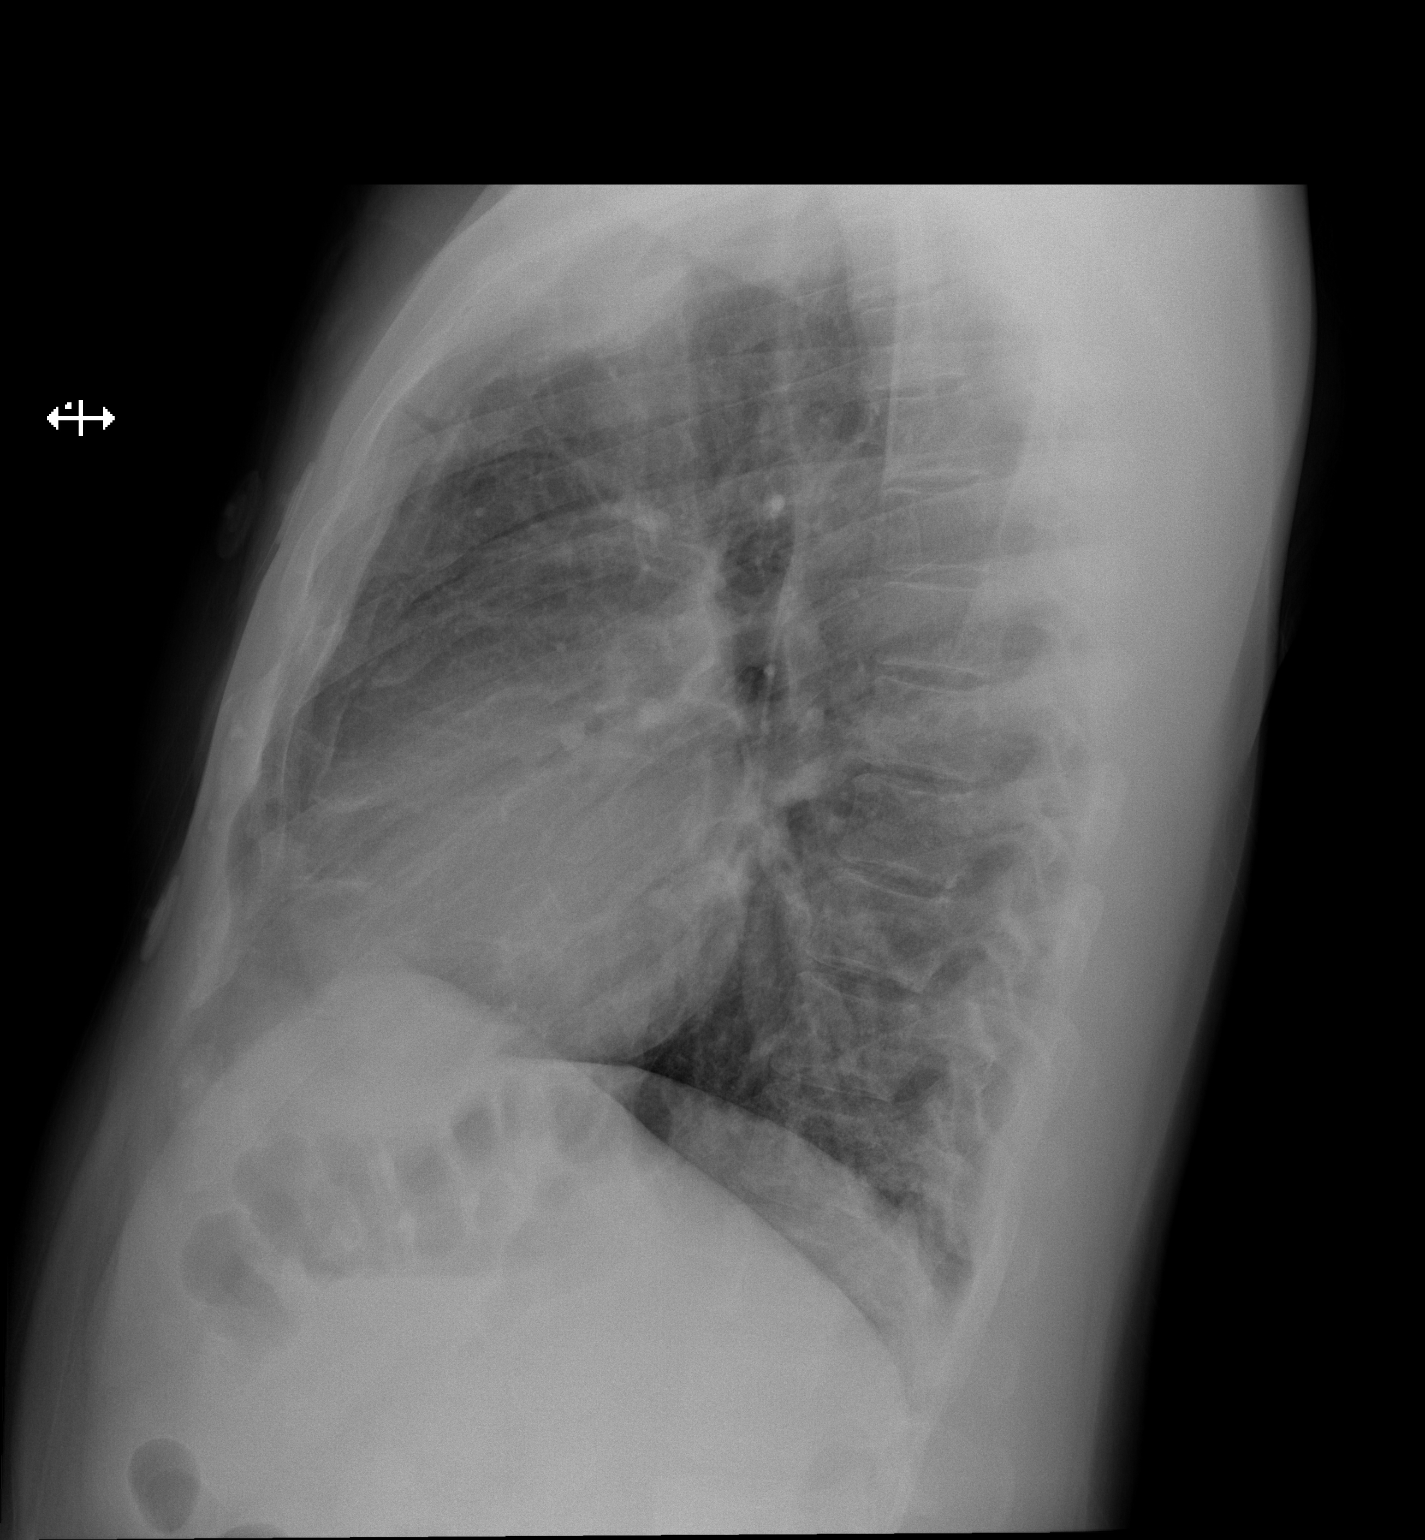

[2 of 2 positions shown; findings below may reference images not displayed]

FINDINGS: The heart size and pulmonary vascularity are normal. Minimal
atelectasis or scarring at the right lung base. Lungs are otherwise
clear. Bones are normal. No effusions.
IMPRESSION: Minimal scarring or atelectasis at the right lung base.

## 2020-03-18 ENCOUNTER — Other Ambulatory Visit: Payer: Self-pay

## 2020-03-18 ENCOUNTER — Ambulatory Visit (INDEPENDENT_AMBULATORY_CARE_PROVIDER_SITE_OTHER): Payer: Self-pay | Admitting: Internal Medicine

## 2020-03-18 ENCOUNTER — Encounter: Payer: Self-pay | Admitting: Internal Medicine

## 2020-03-18 VITALS — BP 134/88 | HR 69 | Wt 213.0 lb

## 2020-03-18 DIAGNOSIS — Z113 Encounter for screening for infections with a predominantly sexual mode of transmission: Secondary | ICD-10-CM

## 2020-03-18 DIAGNOSIS — Z720 Tobacco use: Secondary | ICD-10-CM

## 2020-03-18 DIAGNOSIS — Z23 Encounter for immunization: Secondary | ICD-10-CM

## 2020-03-18 DIAGNOSIS — B20 Human immunodeficiency virus [HIV] disease: Secondary | ICD-10-CM

## 2020-03-18 DIAGNOSIS — Z79899 Other long term (current) drug therapy: Secondary | ICD-10-CM

## 2020-03-18 NOTE — Assessment & Plan Note (Signed)
Will check his lipid panel today 

## 2020-03-18 NOTE — Assessment & Plan Note (Signed)
Hepatitis A #2 today

## 2020-03-18 NOTE — Assessment & Plan Note (Signed)
He continues to do well on Genvoya with no missed doses. No concerns, rtc in 6 months.

## 2020-03-18 NOTE — Assessment & Plan Note (Signed)
Will screen today 

## 2020-03-18 NOTE — Progress Notes (Signed)
   Subjective:    Patient ID: Aaron Stephens, male    DOB: May 31, 1986, 34 y.o.   MRN: [id number]  HPI Here for follow up of HIV He continues on Genvoya with no missed doses.  Last CD4 758 and viral load < 20.  No new concerns. Due for hepatitis A #2.  Had his flu shot at work.  Had Moderna for his COVID vaccine in February.     Review of Systems  Constitutional: Negative for fatigue.  Gastrointestinal: Negative for diarrhea and nausea.  Skin: Negative for rash.       Objective:   Physical Exam Eyes:     General: No scleral icterus. Pulmonary:     Effort: Pulmonary effort is normal.  Neurological:     General: No focal deficit present.     Mental Status: He is alert.  Psychiatric:        Mood and Affect: Mood normal.   SH: no tobacco       Assessment & Plan:

## 2020-03-18 NOTE — Assessment & Plan Note (Signed)
Discussed cessation 

## 2020-03-19 ENCOUNTER — Telehealth: Payer: Self-pay

## 2020-03-19 ENCOUNTER — Other Ambulatory Visit: Payer: Self-pay | Admitting: Internal Medicine

## 2020-03-19 DIAGNOSIS — A749 Chlamydial infection, unspecified: Secondary | ICD-10-CM

## 2020-03-19 LAB — CYTOLOGY, (ORAL, ANAL, URETHRAL) ANCILLARY ONLY
Chlamydia: POSITIVE — AB
Chlamydia: POSITIVE — AB
Comment: NEGATIVE
Comment: NEGATIVE
Comment: NORMAL
Comment: NORMAL
Neisseria Gonorrhea: NEGATIVE
Neisseria Gonorrhea: NEGATIVE

## 2020-03-19 LAB — URINE CYTOLOGY ANCILLARY ONLY
Chlamydia: NEGATIVE
Comment: NEGATIVE
Comment: NORMAL
Neisseria Gonorrhea: NEGATIVE

## 2020-03-19 LAB — T-HELPER CELL (CD4) - (RCID CLINIC ONLY)
CD4 % Helper T Cell: 52 % (ref 33–65)
CD4 T Cell Abs: 658 /uL (ref 400–1790)

## 2020-03-19 MED ORDER — AZITHROMYCIN 500 MG PO TABS: 1000.0000 mg | ORAL_TABLET | Freq: Once | ORAL | 0 refills | Status: AC

## 2020-03-19 MED ORDER — AZITHROMYCIN 500 MG PO TABS: 1000.0000 mg | ORAL_TABLET | Freq: Once | ORAL | 0 refills | Status: DC

## 2020-03-19 NOTE — Addendum Note (Signed)
Addended by: Andree Coss on: 03/19/2020 03:50 PM   Modules accepted: Orders

## 2020-03-19 NOTE — Telephone Encounter (Signed)
-----   Message from Gardiner Barefoot, MD sent at 03/19/2020  2:26 PM EDT ----- He is positive for chlamydia and azithromycin has been sent to his pharmacy thanks

## 2020-03-19 NOTE — Telephone Encounter (Signed)
Patient returned call. RN relayed results, resent prescription to [facility] in [place] for delivery. Andree Coss, RN

## 2020-03-19 NOTE — Telephone Encounter (Signed)
Attempted to call patient regarding lab results. Unable to reach him at this time. Voicemail has not been set up. CMA unable to leave message. Lorenso Courier, [place]

## 2020-03-21 LAB — COMPLETE METABOLIC PANEL WITH GFR
AG Ratio: 1.5 (calc) (ref 1.0–2.5)
ALT: 19 U/L (ref 9–46)
AST: 16 U/L (ref 10–40)
Albumin: 4.3 g/dL (ref 3.6–5.1)
Alkaline phosphatase (APISO): 101 U/L (ref 36–130)
BUN: 11 mg/dL (ref 7–25)
CO2: 28 mmol/L (ref 20–32)
Calcium: 9.7 mg/dL (ref 8.6–10.3)
Chloride: 105 mmol/L (ref 98–110)
Creat: 0.98 mg/dL (ref 0.60–1.35)
GFR, Est African American: 117 mL/min/{1.73_m2} (ref >=60)
GFR, Est Non African American: 101 mL/min/{1.73_m2} (ref >=60)
Globulin: 2.9 g/dL (calc) (ref 1.9–3.7)
Glucose, Bld: 101 mg/dL — ABNORMAL HIGH (ref 65–99)
Potassium: 4.4 mmol/L (ref 3.5–5.3)
Sodium: 141 mmol/L (ref 135–146)
Total Bilirubin: 0.6 mg/dL (ref 0.2–1.2)
Total Protein: 7.2 g/dL (ref 6.1–8.1)

## 2020-03-21 LAB — CBC WITH DIFFERENTIAL/PLATELET
Absolute Monocytes: 825 cells/uL (ref 200–950)
Basophils Absolute: 59 cells/uL (ref 0–200)
Basophils Relative: 1.6 %
Eosinophils Absolute: 148 cells/uL (ref 15–500)
Eosinophils Relative: 4 %
HCT: 43.8 % (ref 38.5–50.0)
Hemoglobin: 14.9 g/dL (ref 13.2–17.1)
Lymphs Abs: 1373 cells/uL (ref 850–3900)
MCH: 30.5 pg (ref 27.0–33.0)
MCHC: 34 g/dL (ref 32.0–36.0)
MCV: 89.6 fL (ref 80.0–100.0)
MPV: 9.3 fL (ref 7.5–12.5)
Monocytes Relative: 22.3 %
Neutro Abs: 1295 cells/uL — ABNORMAL LOW (ref 1500–7800)
Neutrophils Relative %: 35 %
Platelets: 250 10*3/uL (ref 140–400)
RBC: 4.89 10*6/uL (ref 4.20–5.80)
RDW: 13.1 % (ref 11.0–15.0)
Total Lymphocyte: 37.1 %
WBC: 3.7 10*3/uL — ABNORMAL LOW (ref 3.8–10.8)

## 2020-03-21 LAB — LIPID PANEL
Cholesterol: 226 mg/dL — ABNORMAL HIGH (ref <200)
HDL: 51 mg/dL (ref >=40)
LDL Cholesterol (Calc): 156 mg/dL (calc) — ABNORMAL HIGH
Non-HDL Cholesterol (Calc): 175 mg/dL (calc) — ABNORMAL HIGH (ref <130)
Total CHOL/HDL Ratio: 4.4 (calc) (ref <5.0)
Triglycerides: 89 mg/dL (ref <150)

## 2020-03-21 LAB — RPR: RPR Ser Ql: NONREACTIVE

## 2020-03-21 LAB — HIV-1 RNA QUANT-NO REFLEX-BLD
HIV 1 RNA Quant: <20 Copies/mL
HIV-1 RNA Quant, Log: <1.3 Log cps/mL

## 2020-03-25 ENCOUNTER — Other Ambulatory Visit: Payer: Self-pay | Admitting: Internal Medicine

## 2020-03-25 DIAGNOSIS — B20 Human immunodeficiency virus [HIV] disease: Secondary | ICD-10-CM

## 2020-05-14 ENCOUNTER — Other Ambulatory Visit: Payer: Self-pay | Admitting: Internal Medicine

## 2020-07-22 ENCOUNTER — Encounter: Payer: Self-pay | Admitting: *Deleted

## 2020-08-01 ENCOUNTER — Other Ambulatory Visit: Payer: Self-pay

## 2020-08-01 ENCOUNTER — Ambulatory Visit: Payer: PRIVATE HEALTH INSURANCE

## 2020-08-02 ENCOUNTER — Encounter: Payer: Self-pay | Admitting: Internal Medicine

## 2020-09-10 ENCOUNTER — Encounter: Payer: Self-pay | Admitting: *Deleted

## 2020-09-20 ENCOUNTER — Other Ambulatory Visit: Payer: Self-pay | Admitting: Internal Medicine

## 2020-09-20 DIAGNOSIS — B20 Human immunodeficiency virus [HIV] disease: Secondary | ICD-10-CM

## 2020-09-24 ENCOUNTER — Ambulatory Visit: Payer: Medicaid Other | Admitting: Internal Medicine

## 2020-09-26 ENCOUNTER — Ambulatory Visit: Payer: Medicaid Other | Admitting: Internal Medicine

## 2020-10-08 ENCOUNTER — Encounter: Payer: Self-pay | Admitting: Internal Medicine

## 2020-10-08 ENCOUNTER — Other Ambulatory Visit: Payer: Self-pay

## 2020-10-08 ENCOUNTER — Ambulatory Visit (INDEPENDENT_AMBULATORY_CARE_PROVIDER_SITE_OTHER): Payer: Self-pay | Admitting: Internal Medicine

## 2020-10-08 VITALS — BP 117/78 | HR 78 | Temp 98.2°F | Ht 71.0 in | Wt 214.0 lb

## 2020-10-08 DIAGNOSIS — R350 Frequency of micturition: Secondary | ICD-10-CM

## 2020-10-08 DIAGNOSIS — B20 Human immunodeficiency virus [HIV] disease: Secondary | ICD-10-CM

## 2020-10-08 NOTE — Assessment & Plan Note (Signed)
This is a new issue, no previously addressed.  Has been ongoing for several weeks.  No concerning sign but will check a urinalysis for completeness.

## 2020-10-08 NOTE — Assessment & Plan Note (Signed)
Doing well, no concerns and will check his CD4 and viral load today.  If ok, can rtc in 6 months.

## 2020-10-08 NOTE — Progress Notes (Signed)
   Subjective:    Patient ID: Aaron Stephens, male    DOB: 1985-09-01, 35 y.o.   MRN: [id number]  HPI Here for follow up of HIV He continues on Genvoya and denies any missed doses.  No issues getting, taking or tolerating the medication. He complains today of increased urinary frequency, which is new.  No dysuria.  He does drink caffeine.  Has increased water intake.  No other complaints.    Review of Systems  Constitutional: Negative for fatigue.  Gastrointestinal: Negative for diarrhea and nausea.  Skin: Negative for rash.       Objective:   Physical Exam Constitutional:      Appearance: Normal appearance.  Eyes:     General: No scleral icterus. Pulmonary:     Effort: Pulmonary effort is normal.  Neurological:     General: No focal deficit present.     Mental Status: He is alert.  Psychiatric:        Mood and Affect: Mood normal.   SH: + tobacco        Assessment & Plan:

## 2020-10-09 ENCOUNTER — Telehealth: Payer: Self-pay

## 2020-10-09 LAB — T-HELPER CELL (CD4) - (RCID CLINIC ONLY)
CD4 % Helper T Cell: 49 % (ref 33–65)
CD4 T Cell Abs: 1145 /uL (ref 400–1790)

## 2020-10-09 NOTE — Telephone Encounter (Signed)
Attempted to call patient, no answer. Left message on self-identified voicemail letting him know results would be sent through MyChart and to please call with any further questions.   Sandie Ano, RN

## 2020-10-09 NOTE — Telephone Encounter (Signed)
Patient returned RN's call. Relayed results to patient. No questions at this time. Juanita Laster, RMA

## 2020-10-09 NOTE — Telephone Encounter (Signed)
-----   Message from Gardiner Barefoot, MD sent at 10/09/2020 11:03 AM EDT ----- Please let him know his urine test is good, it does not show any signs of infection.  thanks

## 2020-10-11 LAB — URINALYSIS, ROUTINE W REFLEX MICROSCOPIC
Bilirubin Urine: NEGATIVE
Glucose, UA: NEGATIVE
Hgb urine dipstick: NEGATIVE
Ketones, ur: NEGATIVE
Leukocytes,Ua: NEGATIVE
Nitrite: NEGATIVE
Protein, ur: NEGATIVE
Specific Gravity, Urine: 1.02 (ref 1.001–1.035)
pH: 5.5 (ref 5.0–8.0)

## 2020-10-11 LAB — HIV-1 RNA QUANT-NO REFLEX-BLD
HIV 1 RNA Quant: NOT DETECTED Copies/mL
HIV-1 RNA Quant, Log: NOT DETECTED Log cps/mL

## 2020-11-04 ENCOUNTER — Other Ambulatory Visit: Payer: Self-pay | Admitting: Internal Medicine

## 2020-11-04 DIAGNOSIS — B20 Human immunodeficiency virus [HIV] disease: Secondary | ICD-10-CM

## 2020-12-03 ENCOUNTER — Encounter: Payer: Self-pay | Admitting: *Deleted

## 2020-12-18 ENCOUNTER — Ambulatory Visit: Payer: Self-pay

## 2020-12-18 ENCOUNTER — Other Ambulatory Visit: Payer: Self-pay

## 2020-12-19 ENCOUNTER — Telehealth: Payer: Self-pay

## 2020-12-19 NOTE — Telephone Encounter (Signed)
Error

## 2021-01-21 ENCOUNTER — Encounter: Payer: Self-pay | Admitting: Internal Medicine

## 2021-04-08 ENCOUNTER — Ambulatory Visit: Payer: Self-pay | Admitting: Internal Medicine

## 2021-04-14 ENCOUNTER — Encounter: Payer: Self-pay | Admitting: Internal Medicine

## 2021-04-14 ENCOUNTER — Other Ambulatory Visit: Payer: Self-pay

## 2021-04-14 ENCOUNTER — Ambulatory Visit (INDEPENDENT_AMBULATORY_CARE_PROVIDER_SITE_OTHER): Payer: Self-pay | Admitting: Internal Medicine

## 2021-04-14 ENCOUNTER — Other Ambulatory Visit (HOSPITAL_COMMUNITY)
Admission: RE | Admit: 2021-04-14 | Discharge: 2021-04-14 | Disposition: A | Discharge status: Home / Self Care | Payer: Medicaid Other | Source: Ambulatory Visit | Attending: Internal Medicine | Admitting: Internal Medicine

## 2021-04-14 VITALS — BP 127/86 | HR 107 | Temp 98.9°F | Ht 71.0 in | Wt 220.0 lb

## 2021-04-14 DIAGNOSIS — B20 Human immunodeficiency virus [HIV] disease: Secondary | ICD-10-CM | POA: Insufficient documentation

## 2021-04-14 DIAGNOSIS — Z113 Encounter for screening for infections with a predominantly sexual mode of transmission: Secondary | ICD-10-CM

## 2021-04-14 DIAGNOSIS — Z23 Encounter for immunization: Secondary | ICD-10-CM

## 2021-04-14 DIAGNOSIS — Z79899 Other long term (current) drug therapy: Secondary | ICD-10-CM

## 2021-04-14 MED ORDER — GENVOYA 150-150-200-10 MG PO TABS: 1.0000 | ORAL_TABLET | Freq: Every day | ORAL | 11 refills | Status: DC

## 2021-04-14 NOTE — Progress Notes (Signed)
   Subjective:    Patient ID: Aaron Stephens, male    DOB: 25-Jul-1985, 35 y.o.   MRN: [id number]  HPI Here for follow up of HIV He continues on Genvoya with no missed doses reported.  No issues with getting, taking or tolerating the medication.  Labs done at the time of the visit.  He was recently tested and treated for STIs and was positive for gonorrhea.    Review of Systems  Constitutional:  Negative for fatigue.  Gastrointestinal:  Negative for diarrhea and nausea.  Skin:  Negative for rash.      Objective:   Physical Exam Eyes:     General: No scleral icterus. Pulmonary:     Effort: Pulmonary effort is normal.  Skin:    Findings: No rash.  Neurological:     General: No focal deficit present.     Mental Status: He is alert.  Psychiatric:        Mood and Affect: Mood normal.    SH: + tobacco      Assessment & Plan:

## 2021-04-14 NOTE — Assessment & Plan Note (Signed)
Due for pneumovax 23 and given today

## 2021-04-14 NOTE — Assessment & Plan Note (Signed)
Will screen today with swabs urine and blood

## 2021-04-14 NOTE — Assessment & Plan Note (Signed)
He continues to do well and no concerns on Genvoya and no changes indicated.  Labs today and he can follow up in 6 months.

## 2021-04-15 ENCOUNTER — Encounter: Payer: Self-pay | Admitting: Internal Medicine

## 2021-04-15 LAB — T-HELPER CELL (CD4) - (RCID CLINIC ONLY)
CD4 % Helper T Cell: 51 % (ref 33–65)
CD4 T Cell Abs: 1016 /uL (ref 400–1790)

## 2021-04-15 LAB — CYTOLOGY, (ORAL, ANAL, URETHRAL) ANCILLARY ONLY
Chlamydia: NEGATIVE
Comment: NEGATIVE
Comment: NORMAL
Neisseria Gonorrhea: NEGATIVE

## 2021-04-15 LAB — URINE CYTOLOGY ANCILLARY ONLY
Chlamydia: NEGATIVE
Comment: NEGATIVE
Comment: NORMAL
Neisseria Gonorrhea: NEGATIVE

## 2021-04-15 NOTE — Assessment & Plan Note (Signed)
Lipid panel checked and managed by his PCP

## 2021-04-16 LAB — COMPLETE METABOLIC PANEL WITH GFR
AG Ratio: 1.5 (calc) (ref 1.0–2.5)
ALT: 22 U/L (ref 9–46)
AST: 16 U/L (ref 10–40)
Albumin: 4.2 g/dL (ref 3.6–5.1)
Alkaline phosphatase (APISO): 118 U/L (ref 36–130)
BUN: 9 mg/dL (ref 7–25)
CO2: 30 mmol/L (ref 20–32)
Calcium: 9 mg/dL (ref 8.6–10.3)
Chloride: 104 mmol/L (ref 98–110)
Creat: 0.84 mg/dL (ref 0.60–1.26)
Globulin: 2.8 g/dL (calc) (ref 1.9–3.7)
Glucose, Bld: 86 mg/dL (ref 65–99)
Potassium: 4.2 mmol/L (ref 3.5–5.3)
Sodium: 140 mmol/L (ref 135–146)
Total Bilirubin: 0.5 mg/dL (ref 0.2–1.2)
Total Protein: 7 g/dL (ref 6.1–8.1)
eGFR: 117 mL/min/{1.73_m2} (ref >=60)

## 2021-04-16 LAB — CBC WITH DIFFERENTIAL/PLATELET
Absolute Monocytes: 523 cells/uL (ref 200–950)
Basophils Absolute: 69 cells/uL (ref 0–200)
Basophils Relative: 1.1 %
Eosinophils Absolute: 391 cells/uL (ref 15–500)
Eosinophils Relative: 6.2 %
HCT: 43.5 % (ref 38.5–50.0)
Hemoglobin: 14.8 g/dL (ref 13.2–17.1)
Lymphs Abs: 2098 cells/uL (ref 850–3900)
MCH: 30.5 pg (ref 27.0–33.0)
MCHC: 34 g/dL (ref 32.0–36.0)
MCV: 89.5 fL (ref 80.0–100.0)
MPV: 9.1 fL (ref 7.5–12.5)
Monocytes Relative: 8.3 %
Neutro Abs: 3219 cells/uL (ref 1500–7800)
Neutrophils Relative %: 51.1 %
Platelets: 318 10*3/uL (ref 140–400)
RBC: 4.86 10*6/uL (ref 4.20–5.80)
RDW: 12.5 % (ref 11.0–15.0)
Total Lymphocyte: 33.3 %
WBC: 6.3 10*3/uL (ref 3.8–10.8)

## 2021-04-16 LAB — LIPID PANEL
Cholesterol: 217 mg/dL — ABNORMAL HIGH (ref <200)
HDL: 55 mg/dL (ref >=40)
LDL Cholesterol (Calc): 135 mg/dL (calc) — ABNORMAL HIGH
Non-HDL Cholesterol (Calc): 162 mg/dL (calc) — ABNORMAL HIGH (ref <130)
Total CHOL/HDL Ratio: 3.9 (calc) (ref <5.0)
Triglycerides: 145 mg/dL (ref <150)

## 2021-04-16 LAB — HIV-1 RNA QUANT-NO REFLEX-BLD
HIV 1 RNA Quant: NOT DETECTED Copies/mL
HIV-1 RNA Quant, Log: NOT DETECTED Log cps/mL

## 2021-04-16 LAB — RPR: RPR Ser Ql: NONREACTIVE

## 2021-06-23 ENCOUNTER — Encounter: Payer: Self-pay | Admitting: Internal Medicine

## 2021-09-17 ENCOUNTER — Encounter: Payer: Self-pay | Admitting: Internal Medicine

## 2021-10-14 ENCOUNTER — Other Ambulatory Visit: Payer: Self-pay

## 2021-10-14 ENCOUNTER — Ambulatory Visit (INDEPENDENT_AMBULATORY_CARE_PROVIDER_SITE_OTHER): Payer: Self-pay | Admitting: Internal Medicine

## 2021-10-14 ENCOUNTER — Encounter: Payer: Self-pay | Admitting: Internal Medicine

## 2021-10-14 VITALS — BP 121/76 | HR 57 | Temp 97.5°F | Wt 208.0 lb

## 2021-10-14 DIAGNOSIS — B20 Human immunodeficiency virus [HIV] disease: Secondary | ICD-10-CM

## 2021-10-14 DIAGNOSIS — Z720 Tobacco use: Secondary | ICD-10-CM

## 2021-10-14 MED ORDER — GENVOYA 150-150-200-10 MG PO TABS: 1.0000 | ORAL_TABLET | Freq: Every day | ORAL | 11 refills | Status: DC

## 2021-10-14 NOTE — Assessment & Plan Note (Signed)
He continues to do well with his medication, no changes indicated.  ?Labs today and he will rtc in 6 months.  ?

## 2021-10-14 NOTE — Progress Notes (Signed)
? ?  Subjective:  ? ? Patient ID: Aaron Stephens, male    DOB: 11/16/1985, 36 y.o.   MRN: [id number] ? ?HPI ?He is here for follow up of HIV ?He continues on Genvoya and denies any missed doses.   ?No concerns today.  ?No issues with getting, taking or tolerating his medication.  ? ? ?Review of Systems  ?Constitutional:  Negative for fatigue.  ?Gastrointestinal:  Negative for diarrhea and nausea.  ?Skin:  Negative for rash.  ? ?   ?Objective:  ? Physical Exam ?Eyes:  ?   General: No scleral icterus. ?Pulmonary:  ?   Effort: Pulmonary effort is normal.  ?Neurological:  ?   Mental Status: He is alert.  ? ? ? ? ? ?   ?Assessment & Plan:  ? ? ?

## 2021-10-14 NOTE — Assessment & Plan Note (Signed)
Discussed cessation 

## 2021-10-15 ENCOUNTER — Encounter: Payer: Self-pay | Admitting: Internal Medicine

## 2021-10-15 LAB — T-HELPER CELL (CD4) - (RCID CLINIC ONLY)
CD4 % Helper T Cell: 50 % (ref 33–65)
CD4 T Cell Abs: 885 /uL (ref 400–1790)

## 2021-10-17 LAB — HIV-1 RNA QUANT-NO REFLEX-BLD
HIV 1 RNA Quant: NOT DETECTED copies/mL
HIV-1 RNA Quant, Log: NOT DETECTED Log copies/mL

## 2022-03-26 ENCOUNTER — Ambulatory Visit (INDEPENDENT_AMBULATORY_CARE_PROVIDER_SITE_OTHER): Payer: Self-pay | Admitting: Internal Medicine

## 2022-03-26 ENCOUNTER — Encounter: Payer: Self-pay | Admitting: Internal Medicine

## 2022-03-26 ENCOUNTER — Other Ambulatory Visit (HOSPITAL_COMMUNITY)
Admission: RE | Admit: 2022-03-26 | Discharge: 2022-03-26 | Disposition: A | Discharge status: Home / Self Care | Payer: Medicaid Other | Source: Ambulatory Visit | Attending: Internal Medicine | Admitting: Internal Medicine

## 2022-03-26 ENCOUNTER — Other Ambulatory Visit: Payer: Self-pay

## 2022-03-26 VITALS — BP 127/83 | Resp 16 | Ht 73.0 in | Wt 210.0 lb

## 2022-03-26 DIAGNOSIS — Z113 Encounter for screening for infections with a predominantly sexual mode of transmission: Secondary | ICD-10-CM | POA: Diagnosis present

## 2022-03-26 DIAGNOSIS — B20 Human immunodeficiency virus [HIV] disease: Secondary | ICD-10-CM | POA: Diagnosis not present

## 2022-03-26 DIAGNOSIS — Z79899 Other long term (current) drug therapy: Secondary | ICD-10-CM

## 2022-03-26 DIAGNOSIS — Z720 Tobacco use: Secondary | ICD-10-CM

## 2022-03-26 DIAGNOSIS — F17210 Nicotine dependence, cigarettes, uncomplicated: Secondary | ICD-10-CM

## 2022-03-26 MED ORDER — GENVOYA 150-150-200-10 MG PO TABS: 1.0000 | ORAL_TABLET | Freq: Every day | ORAL | 11 refills | Status: DC

## 2022-03-26 NOTE — Assessment & Plan Note (Signed)
Will screen.  Denies any recent sexual activity.   

## 2022-03-26 NOTE — Progress Notes (Signed)
   Subjective:    Patient ID: Aaron Stephens, male    DOB: [birth year], 36 y.o.   MRN: [id number]  HPI Here for follow up of HIV He continues on Genvoya and denies any missed doses. No complaints today.  Asking about checking a lipid panel.  Working on his diet and exercise. No issues getting or taking his medication.    Review of Systems  Constitutional:  Negative for fatigue.  Gastrointestinal:  Negative for diarrhea.  Skin:  Negative for rash.       Objective:   Physical Exam Eyes:     General: No scleral icterus. Pulmonary:     Effort: Pulmonary effort is normal.  Neurological:     Mental Status: He is alert.  Psychiatric:        Mood and Affect: Mood normal.   SH: + tobacco        Assessment & Plan:

## 2022-03-26 NOTE — Assessment & Plan Note (Signed)
I discussed cessation and he is interested in quitting.

## 2022-03-26 NOTE — Assessment & Plan Note (Signed)
Will check his lipid panel 

## 2022-03-26 NOTE — Assessment & Plan Note (Signed)
He continues to do well and no changes indicated.  Refills sent and will check labs today rtc in 6 months.

## 2022-03-27 LAB — URINE CYTOLOGY ANCILLARY ONLY
Chlamydia: NEGATIVE
Comment: NEGATIVE
Comment: NORMAL
Neisseria Gonorrhea: NEGATIVE

## 2022-03-27 LAB — T-HELPER CELL (CD4) - (RCID CLINIC ONLY)
CD4 % Helper T Cell: 50 % (ref 33–65)
CD4 T Cell Abs: 903 /uL (ref 400–1790)

## 2022-03-29 LAB — CBC WITH DIFFERENTIAL/PLATELET
Absolute Monocytes: 455 cells/uL (ref 200–950)
Basophils Absolute: 70 cells/uL (ref 0–200)
Basophils Relative: 1.4 %
Eosinophils Absolute: 445 cells/uL (ref 15–500)
Eosinophils Relative: 8.9 %
HCT: 44 % (ref 38.5–50.0)
Hemoglobin: 14.9 g/dL (ref 13.2–17.1)
Lymphs Abs: 1955 cells/uL (ref 850–3900)
MCH: 30.7 pg (ref 27.0–33.0)
MCHC: 33.9 g/dL (ref 32.0–36.0)
MCV: 90.7 fL (ref 80.0–100.0)
MPV: 9 fL (ref 7.5–12.5)
Monocytes Relative: 9.1 %
Neutro Abs: 2075 cells/uL (ref 1500–7800)
Neutrophils Relative %: 41.5 %
Platelets: 268 10*3/uL (ref 140–400)
RBC: 4.85 10*6/uL (ref 4.20–5.80)
RDW: 12.9 % (ref 11.0–15.0)
Total Lymphocyte: 39.1 %
WBC: 5 10*3/uL (ref 3.8–10.8)

## 2022-03-29 LAB — COMPLETE METABOLIC PANEL WITH GFR
AG Ratio: 1.6 (calc) (ref 1.0–2.5)
ALT: 24 U/L (ref 9–46)
AST: 16 U/L (ref 10–40)
Albumin: 4.5 g/dL (ref 3.6–5.1)
Alkaline phosphatase (APISO): 122 U/L (ref 36–130)
BUN: 9 mg/dL (ref 7–25)
CO2: 30 mmol/L (ref 20–32)
Calcium: 9.3 mg/dL (ref 8.6–10.3)
Chloride: 107 mmol/L (ref 98–110)
Creat: 0.85 mg/dL (ref 0.60–1.26)
Globulin: 2.9 g/dL (calc) (ref 1.9–3.7)
Glucose, Bld: 84 mg/dL (ref 65–99)
Potassium: 4.2 mmol/L (ref 3.5–5.3)
Sodium: 142 mmol/L (ref 135–146)
Total Bilirubin: 0.4 mg/dL (ref 0.2–1.2)
Total Protein: 7.4 g/dL (ref 6.1–8.1)
eGFR: 116 mL/min/{1.73_m2} (ref >=60)

## 2022-03-29 LAB — HIV-1 RNA QUANT-NO REFLEX-BLD
HIV 1 RNA Quant: NOT DETECTED Copies/mL
HIV-1 RNA Quant, Log: NOT DETECTED Log cps/mL

## 2022-03-29 LAB — LIPID PANEL
Cholesterol: 249 mg/dL — ABNORMAL HIGH (ref <200)
HDL: 56 mg/dL (ref >=40)
LDL Cholesterol (Calc): 176 mg/dL (calc) — ABNORMAL HIGH
Non-HDL Cholesterol (Calc): 193 mg/dL (calc) — ABNORMAL HIGH (ref <130)
Total CHOL/HDL Ratio: 4.4 (calc) (ref <5.0)
Triglycerides: 68 mg/dL (ref <150)

## 2022-03-29 LAB — RPR: RPR Ser Ql: NONREACTIVE

## 2022-09-01 ENCOUNTER — Ambulatory Visit (INDEPENDENT_AMBULATORY_CARE_PROVIDER_SITE_OTHER): Payer: Self-pay | Admitting: Internal Medicine

## 2022-09-01 ENCOUNTER — Encounter: Payer: Self-pay | Admitting: Internal Medicine

## 2022-09-01 ENCOUNTER — Other Ambulatory Visit: Payer: Self-pay

## 2022-09-01 VITALS — BP 133/85 | HR 76 | Temp 97.8°F | Wt 210.0 lb

## 2022-09-01 DIAGNOSIS — B20 Human immunodeficiency virus [HIV] disease: Secondary | ICD-10-CM

## 2022-09-01 DIAGNOSIS — Z59819 Housing instability, housed unspecified: Secondary | ICD-10-CM | POA: Insufficient documentation

## 2022-09-01 DIAGNOSIS — Z113 Encounter for screening for infections with a predominantly sexual mode of transmission: Secondary | ICD-10-CM

## 2022-09-01 MED ORDER — GENVOYA 150-150-200-10 MG PO TABS: 1.0000 | ORAL_TABLET | Freq: Every day | ORAL | 11 refills | Status: DC

## 2022-09-01 NOTE — Assessment & Plan Note (Signed)
Recently positive for gonorrhea and will retest today with throat swab and urine

## 2022-09-01 NOTE — Progress Notes (Signed)
   Subjective:    Patient ID: Earna Coder, male    DOB: [birth year], 37 y.o.   MRN: [id number]  HPI Joaopedro is here for follow up of HIV He continues on Genvoya with  no missed doses.  Treated for STIs in January after an ED visit.  No current symptoms.  Sexual activity with females. Having issues with housing.  Two of his sons live with him.    Review of Systems  Constitutional:  Negative for fatigue.  Gastrointestinal:  Negative for diarrhea.  Skin:  Negative for rash.       Objective:   Physical Exam Eyes:     General: No scleral icterus. Pulmonary:     Effort: Pulmonary effort is normal.  Neurological:     Mental Status: He is alert.  Psychiatric:        Mood and Affect: Mood normal.   SH: + tobacco        Assessment & Plan:

## 2022-09-01 NOTE — Assessment & Plan Note (Signed)
Will get him connected with [facility] to help with housing issues.

## 2022-09-01 NOTE — Assessment & Plan Note (Signed)
He continues to do well from this standpoint and will refill medications and check labs.  Otherwise he can follow up in 6 months.

## 2022-09-02 ENCOUNTER — Ambulatory Visit: Payer: Medicaid Other | Admitting: Internal Medicine

## 2022-09-02 LAB — CYTOLOGY, (ORAL, ANAL, URETHRAL) ANCILLARY ONLY
Chlamydia: NEGATIVE
Comment: NEGATIVE
Comment: NORMAL
Neisseria Gonorrhea: NEGATIVE

## 2022-09-02 LAB — T-HELPER CELL (CD4) - (RCID CLINIC ONLY)
CD4 % Helper T Cell: 50 % (ref 33–65)
CD4 T Cell Abs: 1081 /uL (ref 400–1790)

## 2022-09-02 LAB — URINE CYTOLOGY ANCILLARY ONLY
Chlamydia: NEGATIVE
Comment: NEGATIVE
Comment: NORMAL
Neisseria Gonorrhea: NEGATIVE

## 2022-09-03 LAB — HIV-1 RNA QUANT-NO REFLEX-BLD
HIV 1 RNA Quant: NOT DETECTED Copies/mL
HIV-1 RNA Quant, Log: NOT DETECTED Log cps/mL

## 2022-09-03 LAB — RPR: RPR Ser Ql: NONREACTIVE

## 2022-09-09 ENCOUNTER — Encounter: Payer: Self-pay | Admitting: Internal Medicine

## 2022-11-30 ENCOUNTER — Ambulatory Visit: Payer: Self-pay

## 2022-11-30 ENCOUNTER — Other Ambulatory Visit: Payer: Self-pay

## 2023-03-03 ENCOUNTER — Ambulatory Visit: Payer: Self-pay | Admitting: Internal Medicine

## 2023-03-09 ENCOUNTER — Ambulatory Visit (INDEPENDENT_AMBULATORY_CARE_PROVIDER_SITE_OTHER): Payer: Self-pay | Admitting: Internal Medicine

## 2023-03-09 ENCOUNTER — Other Ambulatory Visit: Payer: Self-pay

## 2023-03-09 ENCOUNTER — Encounter: Payer: Self-pay | Admitting: Internal Medicine

## 2023-03-09 VITALS — BP 130/80 | HR 87 | Temp 98.3°F | Resp 16 | Wt 218.0 lb

## 2023-03-09 DIAGNOSIS — Z21 Asymptomatic human immunodeficiency virus [HIV] infection status: Secondary | ICD-10-CM

## 2023-03-09 DIAGNOSIS — Z79899 Other long term (current) drug therapy: Secondary | ICD-10-CM

## 2023-03-09 DIAGNOSIS — Z113 Encounter for screening for infections with a predominantly sexual mode of transmission: Secondary | ICD-10-CM

## 2023-03-09 DIAGNOSIS — B20 Human immunodeficiency virus [HIV] disease: Secondary | ICD-10-CM

## 2023-03-09 DIAGNOSIS — Z720 Tobacco use: Secondary | ICD-10-CM

## 2023-03-09 NOTE — Assessment & Plan Note (Signed)
Will screen 

## 2023-03-09 NOTE — Assessment & Plan Note (Signed)
He is doing well and will check his labs today.  Has refills Follow up in 6 months.

## 2023-03-09 NOTE — Assessment & Plan Note (Signed)
Will check his lipid panel today 

## 2023-03-09 NOTE — Assessment & Plan Note (Signed)
Discussed the importance of cessation.  He is interested in quitting and discussed.

## 2023-03-09 NOTE — Addendum Note (Signed)
Addended by: Marcell Anger on: 03/09/2023 02:16 PM   Modules accepted: Orders

## 2023-03-09 NOTE — Progress Notes (Signed)
   Subjective:    Patient ID: Aaron Stephens, male    DOB: 1985/10/11, 37 y.o.   MRN: [id number]  HPI Aaron Stephens is here for follow up of HIV He continues on Genvoya and denies any missed doses.  No issues getting or taking his medicaiton.  Requests STI testing.  No penile discharge or other symptoms.    Review of Systems  Constitutional:  Negative for fatigue.  Gastrointestinal:  Negative for diarrhea and nausea.  Skin:  Negative for rash.       Objective:   Physical Exam Eyes:     General: No scleral icterus. Pulmonary:     Effort: Pulmonary effort is normal.  Neurological:     Mental Status: He is alert.   SH + tobacco        Assessment & Plan:

## 2023-03-10 ENCOUNTER — Encounter: Payer: Self-pay | Admitting: Internal Medicine

## 2023-03-10 LAB — URINE CYTOLOGY ANCILLARY ONLY
Chlamydia: NEGATIVE
Comment: NEGATIVE
Comment: NORMAL
Neisseria Gonorrhea: NEGATIVE

## 2023-03-10 LAB — T-HELPER CELL (CD4) - (RCID CLINIC ONLY)
CD4 % Helper T Cell: 52 % (ref 33–65)
CD4 T Cell Abs: 1154 /uL (ref 400–1790)

## 2023-03-10 LAB — CYTOLOGY, (ORAL, ANAL, URETHRAL) ANCILLARY ONLY
Chlamydia: NEGATIVE
Chlamydia: NEGATIVE
Comment: NEGATIVE
Comment: NEGATIVE
Comment: NORMAL
Comment: NORMAL
Neisseria Gonorrhea: NEGATIVE
Neisseria Gonorrhea: NEGATIVE

## 2023-03-12 LAB — COMPLETE METABOLIC PANEL WITH GFR
AG Ratio: 1.7 (calc) (ref 1.0–2.5)
ALT: 17 U/L (ref 9–46)
AST: 14 U/L (ref 10–40)
Albumin: 4.3 g/dL (ref 3.6–5.1)
Alkaline phosphatase (APISO): 116 U/L (ref 36–130)
BUN: 10 mg/dL (ref 7–25)
CO2: 28 mmol/L (ref 20–32)
Calcium: 9.3 mg/dL (ref 8.6–10.3)
Chloride: 107 mmol/L (ref 98–110)
Creat: 0.91 mg/dL (ref 0.60–1.26)
Globulin: 2.6 g/dL (ref 1.9–3.7)
Glucose, Bld: 97 mg/dL (ref 65–99)
Potassium: 3.9 mmol/L (ref 3.5–5.3)
Sodium: 141 mmol/L (ref 135–146)
Total Bilirubin: 0.5 mg/dL (ref 0.2–1.2)
Total Protein: 6.9 g/dL (ref 6.1–8.1)
eGFR: 112 mL/min/{1.73_m2} (ref >=60)

## 2023-03-12 LAB — CBC WITH DIFFERENTIAL/PLATELET
Absolute Monocytes: 540 {cells}/uL (ref 200–950)
Basophils Absolute: 91 {cells}/uL (ref 0–200)
Basophils Relative: 1.4 %
Eosinophils Absolute: 546 {cells}/uL — ABNORMAL HIGH (ref 15–500)
Eosinophils Relative: 8.4 %
HCT: 44.4 % (ref 38.5–50.0)
Hemoglobin: 14.5 g/dL (ref 13.2–17.1)
Lymphs Abs: 2490 {cells}/uL (ref 850–3900)
MCH: 30.7 pg (ref 27.0–33.0)
MCHC: 32.7 g/dL (ref 32.0–36.0)
MCV: 94.1 fL (ref 80.0–100.0)
MPV: 9.2 fL (ref 7.5–12.5)
Monocytes Relative: 8.3 %
Neutro Abs: 2834 {cells}/uL (ref 1500–7800)
Neutrophils Relative %: 43.6 %
Platelets: 301 10*3/uL (ref 140–400)
RBC: 4.72 10*6/uL (ref 4.20–5.80)
RDW: 12.6 % (ref 11.0–15.0)
Total Lymphocyte: 38.3 %
WBC: 6.5 10*3/uL (ref 3.8–10.8)

## 2023-03-12 LAB — LIPID PANEL
Cholesterol: 210 mg/dL — ABNORMAL HIGH (ref <200)
HDL: 51 mg/dL (ref >=40)
LDL Cholesterol (Calc): 129 mg/dL — ABNORMAL HIGH
Non-HDL Cholesterol (Calc): 159 mg/dL — ABNORMAL HIGH (ref <130)
Total CHOL/HDL Ratio: 4.1 (calc) (ref <5.0)
Triglycerides: 186 mg/dL — ABNORMAL HIGH (ref <150)

## 2023-03-12 LAB — HIV-1 RNA QUANT-NO REFLEX-BLD
HIV 1 RNA Quant: NOT DETECTED {copies}/mL
HIV-1 RNA Quant, Log: NOT DETECTED {Log}

## 2023-03-12 LAB — RPR: RPR Ser Ql: NONREACTIVE

## 2023-04-05 ENCOUNTER — Other Ambulatory Visit: Payer: Self-pay | Admitting: Internal Medicine

## 2023-04-05 DIAGNOSIS — Z21 Asymptomatic human immunodeficiency virus [HIV] infection status: Secondary | ICD-10-CM

## 2023-09-07 NOTE — Progress Notes (Unsigned)
 HPI: Aaron Stephens is a 38 y.o. male who presents to the [facility] pharmacy clinic for HIV follow-up.  Patient Active Problem List   Diagnosis Date Noted   Housing insecurity 09/01/2022   Urinary frequency 10/08/2020   Hyperlipidemia 05/16/2019   Healthcare maintenance 05/16/2019   Back pain 05/03/2019   Hyperglycemia, unspecified 02/08/2018   Medication monitoring encounter 10/18/2017   Screening examination for venereal disease 01/14/2017   Encounter for long-term (current) use of high-risk medication 01/14/2017   Tobacco abuse 01/14/2017   Depression 04/16/2016   Human immunodeficiency virus I infection (HCC) 03/12/2016   Attention deficit hyperactivity disorder (ADHD) 03/12/2016    Patient's Medications  New Prescriptions   No medications on file  Previous Medications   GENVOYA 150-150-200-10 MG TABS TABLET    TAKE 1 TABLET BY MOUTH DAILY   IBUPROFEN (ADVIL) 800 MG TABLET    Take 800 mg by mouth 3 (three) times daily.   LORATADINE (CLARITIN) 10 MG TABLET    Take 1 tablet by mouth daily.  Modified Medications   No medications on file  Discontinued Medications   No medications on file    Labs: Lab Results  Component Value Date   HIV1RNAQUANT Not Detected 03/09/2023   HIV1RNAQUANT Not Detected 09/01/2022   HIV1RNAQUANT Not Detected 03/26/2022   CD4TABS 1,154 03/09/2023   CD4TABS 1,081 09/01/2022   CD4TABS 903 03/26/2022    RPR and STI Lab Results  Component Value Date   LABRPR NON-REACTIVE 03/09/2023   LABRPR NON-REACTIVE 09/01/2022   LABRPR NON-REACTIVE 03/26/2022   LABRPR NON-REACTIVE 04/14/2021   LABRPR NON-REACTIVE 03/18/2020    STI Results GC CT  03/09/2023  2:16 PM Negative    Negative  Negative    Negative   03/09/2023  1:27 PM Negative  Negative   09/01/2022 11:31 AM Negative    Negative  Negative    Negative   03/26/2022 10:46 AM Negative  Negative   04/14/2021  2:56 PM Negative    Negative  Negative    Negative   03/18/2020 10:30 AM  Negative    Negative    Negative  Negative    Positive    Positive   03/13/2019 11:15 AM Negative  Negative   02/09/2019 12:00 AM Negative  Negative   12/22/2017 12:00 AM Negative  Negative   01/14/2017 12:00 AM Negative  Negative   02/13/2016 12:00 AM Negative  Negative   08/26/2010 11:22 AM  NEGATIVE (NOTE)              ***Normal Reference Range: Negative***  Testing performed using the BD ProbeTec Qx Chlamydia trachomatis and Neisseria gonorrhea amplified DNA assay.  Performed at:  [facility]                [address]               [place], [place] [address]               [id number]   08/14/2009 10:52 AM  NEGATIVE (NOTE)  Testing performed using the BD Probetec ET Chlamydia trachomatis and Neisseria gonorrhea amplified DNA assay.   05/10/2008  1:41 PM  NEGATIVE (NOTE)  Testing performed using the BD Probetec ET Chlamydia trachomatis and Neisseria gonorrhea amplified DNA assay.   02/04/2008  1:36 AM  NEGATIVE (NOTE)  Testing performed using the BD Probetec ET Chlamydia trachomatis  and Neisseria gonorrhea amplified DNA assay.     Hepatitis B Lab Results  Component Value Date   HEPBSAB NON-REACTIVE 03/13/2019   HEPBSAG NON-REACTIVE 03/13/2019   HEPBCAB NON REACTIVE 02/13/2016   Hepatitis C No results found for: "HEPCAB", "HCVRNAPCRQN" Hepatitis A Lab Results  Component Value Date   HAV NON-REACTIVE 03/13/2019   Lipids: Lab Results  Component Value Date   CHOL 210 (H) 03/09/2023   TRIG 186 (H) 03/09/2023   HDL 51 03/09/2023   CHOLHDL 4.1 03/09/2023   VLDL 13 01/14/2017   LDLCALC 129 (H) 03/09/2023    Current HIV Regimen: Genvoya   Assessment: 38 yo male presenting for an HIV follow-up appointment. He remains on genvoya with no reported side effects or issues with obtaining or taking the medication. Last HIV RNA was undetectable and CD4 was 1154 on 03/09/2023. Last CBC, BMP and lipid panel were also on  03/2023.   Immunizations: Previously received the hepatitis B vaccine but antibody was non-reactive so eligible for re-vaccination. Also eligible for COVID vaccine.  Plan: -HIV RNA, CBC, BMP, and HAV ab

## 2023-09-08 ENCOUNTER — Ambulatory Visit: Payer: Self-pay | Admitting: Pharmacist

## 2023-09-08 DIAGNOSIS — Z23 Encounter for immunization: Secondary | ICD-10-CM

## 2023-09-08 DIAGNOSIS — Z21 Asymptomatic human immunodeficiency virus [HIV] infection status: Secondary | ICD-10-CM

## 2023-09-27 ENCOUNTER — Other Ambulatory Visit: Payer: Self-pay | Admitting: Internal Medicine

## 2023-09-27 DIAGNOSIS — Z21 Asymptomatic human immunodeficiency virus [HIV] infection status: Secondary | ICD-10-CM

## 2023-10-19 ENCOUNTER — Other Ambulatory Visit: Payer: Self-pay | Admitting: Infectious Diseases

## 2023-10-19 DIAGNOSIS — Z21 Asymptomatic human immunodeficiency virus [HIV] infection status: Secondary | ICD-10-CM

## 2023-10-19 NOTE — Telephone Encounter (Signed)
Appointment  5/23

## 2023-10-22 ENCOUNTER — Ambulatory Visit: Admitting: Infectious Diseases

## 2023-11-12 ENCOUNTER — Ambulatory Visit: Admitting: Infectious Diseases

## 2023-11-12 ENCOUNTER — Other Ambulatory Visit: Payer: Self-pay

## 2023-11-12 ENCOUNTER — Encounter: Payer: Self-pay | Admitting: Infectious Diseases

## 2023-11-12 VITALS — BP 139/96 | HR 89 | Temp 98.3°F | Ht 73.0 in | Wt 210.0 lb

## 2023-11-12 DIAGNOSIS — Z21 Asymptomatic human immunodeficiency virus [HIV] infection status: Secondary | ICD-10-CM | POA: Diagnosis not present

## 2023-11-12 DIAGNOSIS — G5700 Lesion of sciatic nerve, unspecified lower limb: Secondary | ICD-10-CM | POA: Diagnosis not present

## 2023-11-12 DIAGNOSIS — R454 Irritability and anger: Secondary | ICD-10-CM

## 2023-11-12 DIAGNOSIS — G479 Sleep disorder, unspecified: Secondary | ICD-10-CM

## 2023-11-12 MED ORDER — GENVOYA 150-150-200-10 MG PO TABS: 1.0000 | ORAL_TABLET | Freq: Every day | ORAL | 11 refills | Status: AC

## 2023-11-12 NOTE — Patient Instructions (Addendum)
 [facility] [place]: [address]   [phone]  [email or web address]://[email or web address].com/us   Journaling at night may be a helpful exercise - thought dump where you start     Recommendations for improving sleep:  Avoid having pets sleep in the bedroom Avoid caffeine consumption after 4pm Keep bedroom cool and conducive to sleep Avoid nicotine use, especially in the evening Avoid exercise within 2-3 hours before bedtime  Stimulus Control:  Go to bed only when sleepy Use the bedroom for sleep and sex only Go to another room if you are unable to fall asleep within 15 to 20 minutes Read or engage in other quiet activities and return to bed only when sleepy.

## 2023-11-12 NOTE — Progress Notes (Signed)
 Name: Aaron Stephens  DOB: 07/12/1985 MRN: [id number] PCP: Pcp, No    Brief Narrative:  Aaron Stephens is a 38 y.o. male with HIV, Stage 2, diagnosed 2017. CD4 nadir 490 VL 14,057 copies Transmission Risk: heterosexual History of OIs: none History of STIs: gonorrhea Hep B sAg (-), sAb (- 2020), cAb (-); Hep A (- 2020), Hep C (- 2020) Quantiferon (- 2017) HLA B*5701 () G6PD: ()   Previous Regimens: Genvoya  2017  Genotypes: Not on file  Subjective  Chief Complaint  Patient presents with   Follow-up     Discussed the use of AI scribe software for clinical note transcription with the patient, who gave verbal consent to proceed.  History of Present Illness   Aaron Stephens is a 38 year old male with piriformis syndrome who presents with leg pain and sleep disturbances. Referred by Dr. Efrain for ongoing management of piriformis syndrome and sleep disturbances.  He experiences ongoing leg pain, described as persistent discomfort that worsens after prolonged sitting, such as during an [place] ride. He has discontinued oral medications due to side effects like drowsiness and now uses topical treatments like [facility], which provide some relief.  He has been experiencing sleep disturbances, which he attributes to stress and significant life events over the past year. He feels stressed and irritated, with family issues contributing significantly to his mood. His children recently moved back with their mother, which has been emotionally challenging for him. He describes feeling withdrawn and not wanting to be around people, stating 'I'm sick of people, period.'  He is currently taking Genvoya  once a day for HIV management and has no issues with obtaining or taking this medication. He has a known allergy to bee venom, for which he carries an EpiPen. He did not require epinephrine during a previous bee sting incident, managing with Claritin instead.  He is starting a new job soon,  which he acknowledges as a source of stress.          11/12/2023   11:04 AM  Depression screen PHQ 2/9  Decreased Interest 3  Down, Depressed, Hopeless 3  PHQ - 2 Score 6  Altered sleeping 3  Tired, decreased energy 3  Change in appetite 3  Feeling bad or failure about yourself  3  Trouble concentrating 1  Moving slowly or fidgety/restless 0  Suicidal thoughts 1  PHQ-9 Score 20  Difficult doing work/chores Somewhat difficult      11/12/2023   11:04 AM  GAD 7 : Generalized Anxiety Score  Nervous, Anxious, on Edge 0  Control/stop worrying 3  Worry too much - different things 3  Trouble relaxing 3  Restless 3  Easily annoyed or irritable 3  Afraid - awful might happen 0  Total GAD 7 Score 15  Anxiety Difficulty Somewhat difficult      Review of Systems  Constitutional:  Negative for chills and fever.  HENT:  Negative for tinnitus.   Eyes:  Negative for blurred vision and photophobia.  Respiratory:  Negative for cough and sputum production.   Cardiovascular:  Negative for chest pain.  Gastrointestinal:  Negative for diarrhea, nausea and vomiting.  Genitourinary:  Negative for dysuria.  Skin:  Negative for rash.  Neurological:  Negative for headaches.  Psychiatric/Behavioral:  Positive for depression. Negative for suicidal ideas. The patient is nervous/anxious and has insomnia.     Past Medical History:  Diagnosis Date   ADHD (attention deficit hyperactivity disorder)  Outpatient Medications Prior to Visit  Medication Sig Dispense Refill   ibuprofen  (ADVIL ) 800 MG tablet Take 800 mg by mouth 3 (three) times daily.     loratadine (CLARITIN) 10 MG tablet Take 1 tablet by mouth daily.     GENVOYA  150-150-200-10 MG TABS tablet TAKE 1 TABLET BY MOUTH DAILY 30 tablet 0   No facility-administered medications prior to visit.     Allergies  Allergen Reactions   Bee Venom Itching    Runny eyes    Social History   Tobacco Use   Smoking status: Every Day     Current packs/day: 0.01    Average packs/day: (0.2 ttl pk-yrs)    Types: Cigarettes    Start date: 06/02/2007   Smokeless tobacco: Never   Tobacco comments:      Cutting back . 3-4 cigs per day  Vaping Use   Vaping status: Never Used  Substance Use Topics   Alcohol use: Not Currently   Drug use: No    Family History  Problem Relation Age of Onset   Hypertension Mother    Sickle cell anemia Mother    Heart failure Mother    Hypertension Father     Social History   Substance and Sexual Activity  Sexual Activity Yes   Partners: Female   Birth control/protection: Condom   Comment: condoms offered; refused        Objective  Vitals:   11/12/23 1100  BP: (!) 139/96  Pulse: 89  Temp: 98.3 F (36.8 C)  TempSrc: Temporal  SpO2: 96%  Weight: 210 lb (95.3 kg)  Height: 6' 1 (1.854 m)   Body mass index is 27.71 kg/m.  Physical Exam Constitutional:      Appearance: Normal appearance. He is not ill-appearing.  HENT:     Head: Normocephalic.     Mouth/Throat:     Mouth: Mucous membranes are moist.     Pharynx: Oropharynx is clear.   Eyes:     General: No scleral icterus.   Cardiovascular:     Rate and Rhythm: Normal rate.  Pulmonary:     Effort: Pulmonary effort is normal.   Musculoskeletal:        General: Normal range of motion.     Cervical back: Normal range of motion.   Skin:    Coloration: Skin is not jaundiced or pale.   Neurological:     Mental Status: He is alert and oriented to person, place, and time.   Psychiatric:        Mood and Affect: Mood normal.        Behavior: Behavior normal.        Thought Content: Thought content normal.        Judgment: Judgment normal.        Assessment and Plan    HIV infection - HIV infection well-managed with Genvoya  once daily. No issues with medication adherence. History reviewed through [facility] records.  - Ensure adequate refills of Genvoya . - Order CD4 count and viral load tests.  Piriformis  syndrome - Chronic piriformis syndrome causing sciatic nerve compression, leading to pain and discomfort, exacerbated by prolonged sitting. Previous medication caused drowsiness, leading to discontinuation. Currently using topical treatment [facility]) with some relief. - Recommend using a lacrosse ball or similar object for self-myofascial release to alleviate muscle tension. - Encourage continuation of topical treatment as needed.  Sleep disturbance - Chronic sleep disturbance likely exacerbated by stress and mood issues. Environmental factors and mental preoccupations  contribute to difficulty sleeping. He prefers non-pharmacological interventions due to adverse effects of medications. - Advise on sleep hygiene, including maintaining a cool bedroom environment and limiting bedroom activities to sleep and sex. - Recommend a journaling exercise to offload mental preoccupations before bed.  Depression - Chronic depression with recent exacerbation due to family stressors and life changes. Symptoms include withdrawal and irritability. He expresses reluctance to take medication and prefers to manage symptoms independently. - Provide resources for counseling and talk therapy, emphasizing the potential benefits of having a mental health 'coach'. - Refer to family services for additional support. - Provide information on the Psychology Today website for finding a suitable therapist.     Orders Placed This Encounter  Procedures   HIV 1 RNA quant-no reflex-bld   T-helper cells (CD4) count   Ambulatory referral to Psychology    Referral Priority:   Routine    Referral Type:   Psychiatric    Referral Reason:   Specialty Services Required    Requested Specialty:   Psychology    Number of Visits Requested:   1   Meds ordered this encounter  Medications   elvitegravir-cobicistat-emtricitabine-tenofovir (GENVOYA ) 150-150-200-10 MG TABS tablet    Sig: Take 1 tablet by mouth daily.    Dispense:  30  tablet    Refill:  11    Prescription Type::   Renewal     Return in about 6 months (around 05/13/2024).    Corean Fireman, MSN, NP-C [facility]  [email or web address] Pager: [phone] Office: [phone] [facility] Main Line: [phone] *Secure Chat Communication Welcome

## 2023-11-15 LAB — HIV-1 RNA QUANT-NO REFLEX-BLD
HIV 1 RNA Quant: NOT DETECTED {copies}/mL
HIV-1 RNA Quant, Log: NOT DETECTED {Log_copies}/mL

## 2023-11-15 LAB — T-HELPER CELLS (CD4) COUNT (NOT AT ARMC)
Absolute CD4: 1034 {cells}/uL (ref 490–1740)
CD4 T Helper %: 51 % (ref 30–61)
Total lymphocyte count: 2041 {cells}/uL (ref 850–3900)

## 2023-11-16 ENCOUNTER — Ambulatory Visit: Payer: Self-pay | Admitting: Infectious Diseases

## 2023-11-18 ENCOUNTER — Other Ambulatory Visit: Payer: Self-pay | Admitting: Infectious Diseases

## 2023-11-18 DIAGNOSIS — Z21 Asymptomatic human immunodeficiency virus [HIV] infection status: Secondary | ICD-10-CM

## 2023-12-23 ENCOUNTER — Ambulatory Visit

## 2024-02-16 ENCOUNTER — Encounter (HOSPITAL_COMMUNITY): Payer: Self-pay | Admitting: Emergency Medicine

## 2024-02-16 ENCOUNTER — Other Ambulatory Visit: Payer: Self-pay

## 2024-02-16 ENCOUNTER — Ambulatory Visit (HOSPITAL_COMMUNITY)
Admission: EM | Admit: 2024-02-16 | Discharge: 2024-02-16 | Disposition: A | Discharge status: Home / Self Care | Attending: Internal Medicine | Admitting: Internal Medicine

## 2024-02-16 DIAGNOSIS — R051 Acute cough: Secondary | ICD-10-CM | POA: Diagnosis not present

## 2024-02-16 DIAGNOSIS — J069 Acute upper respiratory infection, unspecified: Secondary | ICD-10-CM

## 2024-02-16 LAB — POC SARS CORONAVIRUS 2 AG -  ED: SARS Coronavirus 2 Ag: NEGATIVE

## 2024-02-16 NOTE — ED Provider Notes (Signed)
 [facility]    CSN: [id number] Arrival date & time: 02/16/24  1435      History   Chief Complaint Chief Complaint  Patient presents with   Cough    HPI Aaron Stephens is a 38 y.o. male.   Aaron Stephens is a 38 y.o. male presenting for chief complaint of cough, congestion, sore throat, and generalized fatigue that started 3 days ago on February 13, 2024.  Today is day 4 of symptoms.  Cough is minimal.  He mostly complains of sore throat that is worsened by swallowing.  Denies nausea, vomiting, diarrhea, abdominal pain, rash, fever, chills, and dizziness.  No recent antibiotic or steroid use reported.  History of tobacco abuse, denies other drug abuse.  History of HIV, takes antiviral without missed doses.  His children are both sick with similar symptoms.  Taking Tylenol and Mucinex with some relief.   Cough   Past Medical History:  Diagnosis Date   ADHD (attention deficit hyperactivity disorder)     Patient Active Problem List   Diagnosis Date Noted   Housing insecurity 09/01/2022   Urinary frequency 10/08/2020   Hyperlipidemia 05/16/2019   Healthcare maintenance 05/16/2019   Back pain 05/03/2019   Hyperglycemia, unspecified 02/08/2018   Medication monitoring encounter 10/18/2017   Screening examination for venereal disease 01/14/2017   Encounter for long-term (current) use of high-risk medication 01/14/2017   Tobacco abuse 01/14/2017   Depression 04/16/2016   Human immunodeficiency virus I infection (HCC) 03/12/2016   Attention deficit hyperactivity disorder (ADHD) 03/12/2016    History reviewed. No pertinent surgical history.     Home Medications    Prior to Admission medications   Medication Sig Start Date End Date Taking? Authorizing Provider  elvitegravir-cobicistat-emtricitabine-tenofovir (GENVOYA ) 150-150-200-10 MG TABS tablet Take 1 tablet by mouth daily. 11/12/23   Melvenia Corean SAILOR, NP  ibuprofen  (ADVIL ) 800 MG tablet Take 800 mg by  mouth 3 (three) times daily. 01/10/23   [provider]  loratadine (CLARITIN) 10 MG tablet Take 1 tablet by mouth daily. 02/01/23   [provider]    Family History Family History  Problem Relation Age of Onset   Hypertension Mother    Sickle cell anemia Mother    Heart failure Mother    Hypertension Father     Social History Social History   Tobacco Use   Smoking status: Every Day    Current packs/day: 0.01    Average packs/day: (0.2 ttl pk-yrs)    Types: Cigarettes    Start date: 06/02/2007   Smokeless tobacco: Never   Tobacco comments:      Cutting back . 3-4 cigs per day  Vaping Use   Vaping status: Never Used  Substance Use Topics   Alcohol use: Not Currently   Drug use: No     Allergies   Bee venom   Review of Systems Review of Systems  Respiratory:  Positive for cough.   Per HPI   Physical Exam Triage Vital Signs ED Triage Vitals  Encounter Vitals Group     BP 02/16/24 1626 120/87     Girls Systolic BP Percentile --      Girls Diastolic BP Percentile --      Boys Systolic BP Percentile --      Boys Diastolic BP Percentile --      Pulse Rate 02/16/24 1626 99     Resp 02/16/24 1626 20     Temp 02/16/24 1626 98.3 F (36.8 C)  Temp Source 02/16/24 1626 Oral     SpO2 02/16/24 1626 98 %     Weight --      Height --      Head Circumference --      Peak Flow --      Pain Score 02/16/24 1622 7     Pain Loc --      Pain Education --      Exclude from Growth Chart --    No data found.  Updated Vital Signs BP 120/87 (BP Location: Right Arm)   Pulse 99   Temp 98.3 F (36.8 C) (Oral)   Resp 20   SpO2 98%   Visual Acuity Right Eye Distance:   Left Eye Distance:   Bilateral Distance:    Right Eye Near:   Left Eye Near:    Bilateral Near:     Physical Exam Vitals and nursing note reviewed.  Constitutional:      Appearance: He is not ill-appearing or toxic-appearing.  HENT:     Head: Normocephalic and atraumatic.      Right Ear: Hearing, tympanic membrane, ear canal and external ear normal.     Left Ear: Hearing, tympanic membrane, ear canal and external ear normal.     Nose: Congestion present.     Mouth/Throat:     Lips: Pink.     Mouth: Mucous membranes are moist. No injury or oral lesions.     Dentition: Normal dentition.     Tongue: No lesions.     Pharynx: Oropharynx is clear. Uvula midline. Posterior oropharyngeal erythema present. No pharyngeal swelling, oropharyngeal exudate, uvula swelling or postnasal drip.     Tonsils: No tonsillar exudate.     Comments: Mild erythema to posterior oropharynx with small amount of clear postnasal drainage visualized. Eyes:     General: Lids are normal. Vision grossly intact. Gaze aligned appropriately.     Extraocular Movements: Extraocular movements intact.     Conjunctiva/sclera: Conjunctivae normal.  Neck:     Trachea: Trachea and phonation normal.  Cardiovascular:     Rate and Rhythm: Normal rate and regular rhythm.     Heart sounds: Normal heart sounds, S1 normal and S2 normal.  Pulmonary:     Effort: Pulmonary effort is normal. No respiratory distress.     Breath sounds: Normal breath sounds and air entry.  Musculoskeletal:     Cervical back: Neck supple.  Lymphadenopathy:     Cervical: No cervical adenopathy.  Skin:    General: Skin is warm and dry.     Capillary Refill: Capillary refill takes less than 2 seconds.     Findings: No rash.  Neurological:     General: No focal deficit present.     Mental Status: He is alert and oriented to person, place, and time. Mental status is at baseline.     Cranial Nerves: No dysarthria or facial asymmetry.  Psychiatric:        Mood and Affect: Mood normal.        Speech: Speech normal.        Behavior: Behavior normal.        Thought Content: Thought content normal.        Judgment: Judgment normal.      UC Treatments / Results  Labs (all labs ordered are listed, but only abnormal results are  displayed) Labs Reviewed  POC SARS CORONAVIRUS 2 AG -  ED    EKG   Radiology No results found.  Procedures  Procedures (including critical care time)  Medications Ordered in UC Medications - No data to display  Initial Impression / Assessment and Plan / UC Course  I have reviewed the triage vital signs and the nursing notes.  Pertinent labs & imaging results that were available during my care of the patient were reviewed by me and considered in my medical decision making (see chart for details).   1. Acute cough Suspect viral URI, viral syndrome.  Strep/viral testing: POC COVID-19 negative.   Physical exam findings reassuring, vital signs hemodynamically stable, and lungs clear, therefore deferred imaging of the chest.  Advised supportive care/prescriptions for symptomatic relief as outlined in AVS.    Counseled patient on potential for adverse effects with medications prescribed/recommended today, strict ER and return-to-clinic precautions discussed, patient verbalized understanding.     Final Clinical Impressions(s) / UC Diagnoses   Final diagnoses:  Acute cough  Viral URI with cough     Discharge Instructions      You have a viral illness which will improve on its own with rest, fluids, and medications to help with your symptoms.  COVID-19 testing is negative.   Tylenol, guaifenesin (plain mucinex), and saline nasal sprays may help relieve symptoms.   Zyrtec 10mg  daily to dry up mucous.   Two teaspoons of honey in 1 cup of warm water every 4-6 hours may help with throat pains.  Humidifier in room at nighttime may help soothe cough (clean well daily).   For chest pain, shortness of breath, inability to keep food or fluids down without vomiting, fever that does not respond to tylenol or motrin , or any other severe symptoms, please go to the ER for further evaluation. Return to urgent care as needed, otherwise follow-up with PCP.       ED Prescriptions    None    PDMP not reviewed this encounter.   Enedelia Dorna HERO, [place] 02/16/24 1747

## 2024-02-16 NOTE — Discharge Instructions (Addendum)
 You have a viral illness which will improve on its own with rest, fluids, and medications to help with your symptoms.  COVID-19 testing is negative.   Tylenol, guaifenesin (plain mucinex), and saline nasal sprays may help relieve symptoms.   Zyrtec 10mg  daily to dry up mucous.   Two teaspoons of honey in 1 cup of warm water every 4-6 hours may help with throat pains.  Humidifier in room at nighttime may help soothe cough (clean well daily).   For chest pain, shortness of breath, inability to keep food or fluids down without vomiting, fever that does not respond to tylenol or motrin , or any other severe symptoms, please go to the ER for further evaluation. Return to urgent care as needed, otherwise follow-up with PCP.

## 2024-02-16 NOTE — ED Triage Notes (Signed)
 Patient reports issues started Saturday night.  Initially itchy throat, runny nose, throat drainage and cough.   Has taken benadryl, nyquil, mucinex.patient has a chipped tooth on right, top  and has a wisdom tooth on the right top of mouth that is hurting and seems to have increased with pain with the sinus issues.  Frequent sniffles

## 2024-05-16 ENCOUNTER — Encounter: Payer: Self-pay | Admitting: Infectious Diseases

## 2024-05-16 ENCOUNTER — Other Ambulatory Visit: Payer: Self-pay

## 2024-05-16 ENCOUNTER — Ambulatory Visit: Admitting: Infectious Diseases

## 2024-05-16 ENCOUNTER — Other Ambulatory Visit (HOSPITAL_COMMUNITY)
Admission: RE | Admit: 2024-05-16 | Discharge: 2024-05-16 | Disposition: A | Discharge status: Home / Self Care | Source: Ambulatory Visit

## 2024-05-16 VITALS — BP 120/75 | HR 86 | Temp 97.7°F | Ht 71.0 in | Wt 189.0 lb

## 2024-05-16 DIAGNOSIS — Z79899 Other long term (current) drug therapy: Secondary | ICD-10-CM | POA: Diagnosis not present

## 2024-05-16 DIAGNOSIS — Z21 Asymptomatic human immunodeficiency virus [HIV] infection status: Secondary | ICD-10-CM

## 2024-05-16 DIAGNOSIS — Z23 Encounter for immunization: Secondary | ICD-10-CM

## 2024-05-16 DIAGNOSIS — Z113 Encounter for screening for infections with a predominantly sexual mode of transmission: Secondary | ICD-10-CM | POA: Diagnosis present

## 2024-05-16 DIAGNOSIS — T63444S Toxic effect of venom of bees, undetermined, sequela: Secondary | ICD-10-CM

## 2024-05-16 DIAGNOSIS — T782XXS Anaphylactic shock, unspecified, sequela: Secondary | ICD-10-CM | POA: Diagnosis not present

## 2024-05-16 DIAGNOSIS — Z59819 Housing instability, housed unspecified: Secondary | ICD-10-CM

## 2024-05-16 MED ORDER — EPINEPHRINE 0.3 MG/0.3ML IJ SOAJ: 0.3000 mg | INTRAMUSCULAR | 0 refills | Status: AC | PRN

## 2024-05-16 MED ORDER — LORATADINE 10 MG PO TABS: 10.0000 mg | ORAL_TABLET | Freq: Every day | ORAL | 11 refills | Status: AC

## 2024-05-16 NOTE — Patient Instructions (Addendum)
 Please continue the Genvoya  everyday with food   Will let you know what results show from pending tests we talked about    [facility] - case management that may have some helpful resources for you. Call to leave a message with their intake line to let them know you need to enroll as a new client and leave permission for them to call you back.  [address]  [phone]  [facility] : [phone]

## 2024-05-16 NOTE — Progress Notes (Signed)
 Name: RONDARIUS KADRMAS  DOB: 05/18/1986 MRN: [id number] PCP: Pcp, No    Brief Narrative:  ABHAY GODBOLT is a 38 y.o. male with HIV, Stage 2, diagnosed 2017. CD4 nadir 490 VL 14,057 copies Transmission Risk: heterosexual History of OIs: none History of STIs: gonorrhea Hep B sAg (-), sAb (- 2020), cAb (-); Hep A (- 2020), Hep C (- 2020) Quantiferon (- 2017) HLA B*5701 () G6PD: ()   Previous Regimens: Genvoya  2017  Genotypes: Not on file  Subjective  Chief Complaint  Patient presents with   Follow-up    Flu shot today/ / STD testing      Discussed the use of AI scribe software for clinical note transcription with the patient, who gave verbal consent to proceed.  History of Present Illness   SOTERO BRINKMEYER is a 38 year old male with HIV who presents for follow-up care.  His last visit in June showed a CD4 count of 1034 and an undetectable viral load while taking Genvoya . No issues with medication adherence or side effects are reported.  In September, he experienced an acute upper respiratory infection but has otherwise been well. He previously reported exacerbation of depression and sleep disturbances, but notes improvement in his mental health as he is working with certain people to control his environment.  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 He has been stung in the same place annually. His previous EpiPen  expired, and he used Claritin  for his allergies. He has not had to use an EpiPen  before.  He currently resides in a hotel, which he describes as unsatisfactory. He is seeking resources for housing assistance due to financial constraints from debts on two properties. He expresses a need for resources to improve his housing situation.  He has teenagers, one of whom recently turned sixteen. He has isolated himself from them as part of controlling his environment, and notes limited communication  with them.  No current health concerns, no trouble with medication, no fevers, reports nasal congestion.          11/12/2023   11:04 AM  Depression screen PHQ 2/9  Decreased Interest 3  Down, Depressed, Hopeless 3  PHQ - 2 Score 6  Altered sleeping 3  Tired, decreased energy 3  Change in appetite 3  Feeling bad or failure about yourself  3  Trouble concentrating 1  Moving slowly or fidgety/restless 0  Suicidal thoughts 1  PHQ-9 Score 20   Difficult doing work/chores Somewhat difficult     Data saved with a previous flowsheet row definition      11/12/2023   11:04 AM  GAD 7 : Generalized Anxiety Score  Nervous, Anxious, on Edge 0  Control/stop worrying 3  Worry too much - different things 3  Trouble relaxing 3  Restless 3  Easily annoyed or irritable 3  Afraid - awful might happen 0  Total GAD 7 Score 15  Anxiety Difficulty Somewhat difficult      Review of Systems  Constitutional:  Negative for chills and fever.  HENT:  Negative for tinnitus.   Eyes:  Negative for blurred vision and photophobia.  Respiratory:  Negative for cough and sputum production.   Cardiovascular:  Negative for chest pain.  Gastrointestinal:  Negative for diarrhea, nausea and vomiting.  Genitourinary:  Negative for dysuria.  Skin:  Negative for rash.  Neurological:  Negative for headaches.  Psychiatric/Behavioral:  Positive for depression. Negative  for suicidal ideas. The patient is nervous/anxious and has insomnia.     Past Medical History:  Diagnosis Date   ADHD (attention deficit hyperactivity disorder)     Outpatient Medications Prior to Visit  Medication Sig Dispense Refill   elvitegravir-cobicistat-emtricitabine-tenofovir (GENVOYA ) 150-150-200-10 MG TABS tablet Take 1 tablet by mouth daily. 30 tablet 11   ibuprofen  (ADVIL ) 800 MG tablet Take 800 mg by mouth 3 (three) times daily.     loratadine  (CLARITIN ) 10 MG tablet Take 1 tablet by mouth daily.     No facility-administered  medications prior to visit.     Allergies  Allergen Reactions   Bee Venom Itching    Runny eyes    Social History   Tobacco Use   Smoking status: Every Day    Current packs/day: 0.01    Average packs/day: (0.2 ttl pk-yrs)    Types: Cigarettes    Start date: 06/02/2007   Smokeless tobacco: Never   Tobacco comments:      Cutting back . 3-4 cigs per day  Vaping Use   Vaping status: Never Used  Substance Use Topics   Alcohol use: Not Currently   Drug use: No    Family History  Problem Relation Age of Onset   Hypertension Mother    Sickle cell anemia Mother    Heart failure Mother    Hypertension Father     Social History   Substance and Sexual Activity  Sexual Activity Yes   Partners: Female   Birth control/protection: Condom   Comment: condoms offered; refused        Objective  Vitals:   05/16/24 1350  BP: 120/75  Pulse: 86  Temp: 97.7 F (36.5 C)  TempSrc: Oral  SpO2: 97%  Weight: 189 lb (85.7 kg)  Height: 5' 11 (1.803 m)   Body mass index is 26.36 kg/m.  Physical Exam Constitutional:      Appearance: Normal appearance. He is not ill-appearing.  HENT:     Head: Normocephalic.     Mouth/Throat:     Mouth: Mucous membranes are moist.     Pharynx: Oropharynx is clear.  Eyes:     General: No scleral icterus. Cardiovascular:     Rate and Rhythm: Normal rate.  Pulmonary:     Effort: Pulmonary effort is normal.  Musculoskeletal:        General: Normal range of motion.     Cervical back: Normal range of motion.  Skin:    Coloration: Skin is not jaundiced or pale.  Neurological:     Mental Status: He is alert and oriented to person, place, and time.  Psychiatric:        Mood and Affect: Mood normal.        Behavior: Behavior normal.        Thought Content: Thought content normal.        Judgment: Judgment normal.        Assessment and Plan    Human immunodeficiency virus infection - HIV infection is well-controlled with Genvoya . CD4  count is 1034 and viral load is undetectable. No issues with medication adherence reported. 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 Previous EpiPen  expired. No recent bee encounters reported. Discussed the importance of having an EpiPen  available due to the risk of anaphylaxis, which can be life-threatening. - Prescribed EpiPen  for emergency use -  Educated on EpiPen  use and availability of educational resources  Routine screening for sexually transmitted infections Routine screening for sexually transmitted infections discussed. No symptoms reported, but screening is part of regular health maintenance.  Housing instability Current housing situation is unstable, residing in a hotel. Financial obligations to two properties are contributing to housing instability. Discussed potential resources for housing support. - Referred to [facility] for housing assistance - Provided contact information for [facility] for additional support  Need for influenza vaccination Influenza vaccination is due. Previous flu shot last year was skipped, leading to illness. Discussed the importance of vaccination to prevent illness. - Administered influenza vaccination        Orders Placed This Encounter  Procedures   Flu vaccine trivalent PF, 6mos and older(Flulaval,Afluria,Fluarix,Fluzone)   RPR W/RFLX TO RPR TITER, TREPONEMAL AB, SCREEN AND DIAGNOSIS    Standing Status:   Future    Number of Occurrences:   1    Expected Date:   05/16/2024    Expiration Date:   08/23/2024   HIV 1 RNA quant-no reflex-bld   T-helper cells (CD4) count   AMB REFERRAL TO COMMUNITY SERVICE AGENCY    Referral Priority:   Routine    Referral Type:   Community Service    Number of Visits Requested:   1   AMB REFERRAL TO COMMUNITY SERVICE AGENCY     Referral Priority:   Routine    Referral Type:   Community Service    Number of Visits Requested:   1   Meds ordered this encounter  Medications   EPINEPHrine  0.3 mg/0.3 mL IJ SOAJ injection    Sig: Inject 0.3 mg into the muscle as needed for anaphylaxis.    Dispense:  1 each    Refill:  0   loratadine  (CLARITIN ) 10 MG tablet    Sig: Take 1 tablet (10 mg total) by mouth daily.    Dispense:  30 tablet    Refill:  11     No follow-ups on file.    Corean Fireman, MSN, NP-C [facility]  [email or web address] Pager: [phone] Office: [phone] [facility] Main Line: [phone] *Secure Chat Communication Welcome

## 2024-05-17 ENCOUNTER — Ambulatory Visit: Payer: Self-pay | Admitting: Infectious Diseases

## 2024-05-17 ENCOUNTER — Other Ambulatory Visit (HOSPITAL_COMMUNITY)
Admission: RE | Admit: 2024-05-17 | Discharge: 2024-05-17 | Disposition: A | Discharge status: Home / Self Care | Source: Ambulatory Visit | Attending: Infectious Diseases | Admitting: Infectious Diseases

## 2024-05-17 ENCOUNTER — Other Ambulatory Visit: Payer: Self-pay

## 2024-05-17 DIAGNOSIS — Z21 Asymptomatic human immunodeficiency virus [HIV] infection status: Secondary | ICD-10-CM | POA: Insufficient documentation

## 2024-05-17 DIAGNOSIS — Z113 Encounter for screening for infections with a predominantly sexual mode of transmission: Secondary | ICD-10-CM | POA: Diagnosis present

## 2024-05-17 LAB — URINE CYTOLOGY ANCILLARY ONLY
Chlamydia: POSITIVE — AB
Comment: NEGATIVE
Comment: NORMAL
Neisseria Gonorrhea: NEGATIVE

## 2024-05-17 LAB — T-HELPER CELLS (CD4) COUNT (NOT AT ARMC)
CD4 % Helper T Cell: 53 % (ref 33–65)
CD4 T Cell Abs: 997 /uL (ref 400–1790)

## 2024-05-17 MED ORDER — DOXYCYCLINE HYCLATE 100 MG PO TABS: 100.0000 mg | ORAL_TABLET | Freq: Two times a day (BID) | ORAL | 0 refills | Status: AC

## 2024-05-17 NOTE — Addendum Note (Signed)
 Addended by: GRETEL TULLY HERO on: 05/17/2024 08:59 AM   Modules accepted: Orders

## 2024-05-18 LAB — CYTOLOGY, (ORAL, ANAL, URETHRAL) ANCILLARY ONLY
Chlamydia: NEGATIVE
Comment: NEGATIVE
Comment: NORMAL
Neisseria Gonorrhea: NEGATIVE

## 2024-05-18 LAB — HIV-1 RNA QUANT-NO REFLEX-BLD
HIV 1 RNA Quant: <20 {copies}/mL — AB
HIV-1 RNA Quant, Log: <1.3 {Log_copies}/mL — AB

## 2024-05-18 LAB — SYPHILIS: RPR W/REFLEX TO RPR TITER AND TREPONEMAL ANTIBODIES, TRADITIONAL SCREENING AND DIAGNOSIS ALGORITHM: RPR Ser Ql: NONREACTIVE
